# Patient Record
Sex: Female | Born: 2000 | Hispanic: No | Marital: Single | Smoking: Former smoker
Health system: Southern US, Community
[De-identification: ages and names within clinical notes are randomized; demographics above are authoritative.]

## PROBLEM LIST (undated history)

## (undated) ENCOUNTER — Emergency Department (HOSPITAL_COMMUNITY): Admission: EM | Payer: Medicaid Other

## (undated) DIAGNOSIS — R51 Headache: Secondary | ICD-10-CM

## (undated) DIAGNOSIS — B999 Unspecified infectious disease: Secondary | ICD-10-CM

## (undated) DIAGNOSIS — O24419 Gestational diabetes mellitus in pregnancy, unspecified control: Secondary | ICD-10-CM

## (undated) DIAGNOSIS — R519 Headache, unspecified: Secondary | ICD-10-CM

## (undated) HISTORY — PX: NO PAST SURGERIES: SHX2092

---
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
## 2018-06-18 ENCOUNTER — Other Ambulatory Visit: Payer: Self-pay

## 2018-06-18 ENCOUNTER — Inpatient Hospital Stay (HOSPITAL_COMMUNITY)
Admission: AD | Admit: 2018-06-18 | Discharge: 2018-06-18 | Disposition: A | Discharge status: Home / Self Care | Payer: PRIVATE HEALTH INSURANCE | Attending: Obstetrics and Gynecology | Admitting: Obstetrics and Gynecology

## 2018-06-18 ENCOUNTER — Encounter (HOSPITAL_COMMUNITY): Payer: Self-pay | Admitting: *Deleted

## 2018-06-18 DIAGNOSIS — N939 Abnormal uterine and vaginal bleeding, unspecified: Secondary | ICD-10-CM | POA: Insufficient documentation

## 2018-06-18 DIAGNOSIS — N760 Acute vaginitis: Secondary | ICD-10-CM

## 2018-06-18 DIAGNOSIS — B9689 Other specified bacterial agents as the cause of diseases classified elsewhere: Secondary | ICD-10-CM

## 2018-06-18 LAB — CBC
HCT: 37.2 % (ref 36.0–46.0)
Hemoglobin: 11.5 g/dL — ABNORMAL LOW (ref 12.0–15.0)
MCH: 23.5 pg — ABNORMAL LOW (ref 26.0–34.0)
MCHC: 30.9 g/dL (ref 30.0–36.0)
MCV: 75.9 fL — ABNORMAL LOW (ref 80.0–100.0)
NRBC: 0 % (ref 0.0–0.2)
Platelets: 361 10*3/uL (ref 150–400)
RBC: 4.9 MIL/uL (ref 3.87–5.11)
RDW: 17.9 % — AB (ref 11.5–15.5)
WBC: 11.3 10*3/uL — AB (ref 4.0–10.5)

## 2018-06-18 LAB — URINALYSIS, ROUTINE W REFLEX MICROSCOPIC
Bacteria, UA: NONE SEEN
Bilirubin Urine: NEGATIVE
Glucose, UA: NEGATIVE mg/dL
Ketones, ur: NEGATIVE mg/dL
Leukocytes, UA: NEGATIVE
Nitrite: NEGATIVE
Protein, ur: NEGATIVE mg/dL
Specific Gravity, Urine: 1.018 (ref 1.005–1.030)
pH: 7 (ref 5.0–8.0)

## 2018-06-18 LAB — WET PREP, GENITAL
Sperm: NONE SEEN
Trich, Wet Prep: NONE SEEN
Yeast Wet Prep HPF POC: NONE SEEN

## 2018-06-18 LAB — POCT PREGNANCY, URINE: Preg Test, Ur: NEGATIVE

## 2018-06-18 MED ORDER — METRONIDAZOLE 500 MG PO TABS: 500.0000 mg | ORAL_TABLET | Freq: Two times a day (BID) | ORAL | 0 refills | Status: DC

## 2018-06-18 MED ORDER — NORGESTIMATE-ETH ESTRADIOL 0.25-35 MG-MCG PO TABS: 1.0000 | ORAL_TABLET | Freq: Every day | ORAL | 11 refills | Status: DC

## 2018-06-18 NOTE — Discharge Instructions (Signed)
Abnormal Uterine Bleeding  Abnormal uterine bleeding is unusual bleeding from the uterus. It includes:   Bleeding or spotting between periods.   Bleeding after sex.   Bleeding that is heavier than normal.   Periods that last longer than usual.   Bleeding after menopause.  Abnormal uterine bleeding can affect women at various stages in life, including teenagers, women in their reproductive years, pregnant women, and women who have reached menopause. Common causes of abnormal uterine bleeding include:   Pregnancy.   Growths of tissue (polyps).   A noncancerous tumor in the uterus (fibroid).   Infection.   Cancer.   Hormonal imbalances.  Any type of abnormal bleeding should be evaluated by a health care provider. Many cases are minor and simple to treat, while others are more serious. Treatment will depend on the cause of the bleeding.  Follow these instructions at home:   Monitor your condition for any changes.   Do not use tampons, douche, or have sex if told by your health care provider.   Change your pads often.   Get regular exams that include pelvic exams and cervical cancer screening.   Keep all follow-up visits as told by your health care provider. This is important.  Contact a health care provider if:   Your bleeding lasts for more than one week.   You feel dizzy at times.   You feel nauseous or you vomit.  Get help right away if:   You pass out.   Your bleeding soaks through a pad every hour.   You have abdominal pain.   You have a fever.   You become sweaty or weak.   You pass large blood clots from your vagina.  Summary   Abnormal uterine bleeding is unusual bleeding from the uterus.   Any type of abnormal bleeding should be evaluated by a health care provider. Many cases are minor and simple to treat, while others are more serious.   Treatment will depend on the cause of the bleeding.  This information is not intended to replace advice given to you by your health care provider.  Make sure you discuss any questions you have with your health care provider.  Document Released: 06/16/2005 Document Revised: 07/18/2016 Document Reviewed: 07/18/2016  Elsevier Interactive Patient Education  2019 Elsevier Inc.

## 2018-06-18 NOTE — MAU Note (Signed)
Ever since she had her birth control implanted (June), has been bleeding. At times feels weak and there is an odor.

## 2018-06-18 NOTE — Progress Notes (Signed)
Pt obtained vaginal cultures via self swabbing.

## 2018-06-18 NOTE — MAU Provider Note (Signed)
  History     CSN: [id number]  Arrival date and time: 06/18/18 [id number]   First Provider Initiated Contact with Patient 06/18/18 1052      Chief Complaint  Patient presents with  . Vaginal Bleeding  . Abdominal Pain   Katelyn White is a 17 y.o. G1P0010 presenting for continuous vaginal bleeding since August. After her Nexplanon insertion, she had a normal period in July, but her bleeding did not stop after her August cycle. Her bleeding varies from light spotting to heavy bleeding, but she has not had a day without bleeding. On heavy days, she uses a super tampon every 2-3hrs, and had to get up to change overnight pads multiple times per night. Reports new onset of an intermittent fishy odor, constant fatigue, weakness, and always feels cold. Denies SOB or dizziness.    Past Medical History:  Diagnosis Date  . Headache   . Infection    UTI    Past Surgical History:  Procedure Laterality Date  . NO PAST SURGERIES      Family History  Problem Relation Age of Onset  . Diabetes Mother   . Hypertension Father     Social History   Tobacco Use  . Smoking status: Current Some Day Smoker    Years: 2.00    Types: Cigarettes  . Smokeless tobacco: Never Used  Substance Use Topics  . Alcohol use: Yes    Comment: weekends  . Drug use: No    Allergies: No Known Allergies  No medications prior to admission.    Review of Systems  Constitutional: Positive for fatigue. Negative for activity change, appetite change, chills, fever and unexpected weight change.  HENT: Negative.   Eyes: Negative.   Respiratory: Negative.  Negative for shortness of breath.   Cardiovascular: Negative.  Negative for chest pain.  Gastrointestinal: Negative.   Endocrine: Positive for cold intolerance.  Genitourinary: Positive for vaginal bleeding. Negative for pelvic pain and vaginal pain.  Musculoskeletal: Negative.   Skin: Negative.   Allergic/Immunologic: Negative.   Neurological: Positive for  weakness and light-headedness. Negative for dizziness and headaches.  Hematological: Negative.   Psychiatric/Behavioral: Negative.    Physical Exam   Blood pressure 126/73, pulse 83, temperature 98.4 F (36.9 C), resp. rate 16, weight 63.8 kg, SpO2 99 %.  Physical Exam  Constitutional: She is oriented to person, place, and time. She appears well-developed and well-nourished. No distress.  HENT:  Head: Normocephalic.  Eyes:  Sclera pink, without pallor  Neck: Normal range of motion.  Cardiovascular: Normal rate, regular rhythm and normal heart sounds.  Respiratory: Effort normal and breath sounds normal. No respiratory distress.  GI: Soft. Bowel sounds are normal. She exhibits no distension. There is no abdominal tenderness.  Musculoskeletal: Normal range of motion.  Neurological: She is alert and oriented to person, place, and time.  Skin: Skin is warm and dry. She is not diaphoretic.  Psychiatric: She has a normal mood and affect. Her behavior is normal. Judgment and thought content normal.    MAU Course  Procedures  MDM CBC Self swab wet prep, GC/CT  Assessment and Plan  Abnormal uterine bleeding - Norgestimate-Eth Estradiol 0.25-35mg /mcg 1 tablet oral daily Bacterial vaginosis - metronidazole 500mg  oral 2 times daily  Bernerd LimboJamilla R Tomi Paddock, SNM 06/18/2018, 11:19 AM

## 2018-06-21 LAB — GC/CHLAMYDIA PROBE AMP (~~LOC~~) NOT AT ARMC
Chlamydia: NEGATIVE
NEISSERIA GONORRHEA: NEGATIVE

## 2018-06-29 NOTE — MAU Provider Note (Addendum)
History     CSN: [id number]  Arrival date and time: 06/18/18 [id number]   First Provider Initiated Contact with Patient 06/18/18 1052      Chief Complaint  Patient presents with  . Vaginal Bleeding  . Abdominal Pain   HPI  Ms.  Katelyn White is a 17 y.o. year old 451P0010 female who presents to MAU reporting continuous VB since August of this year. She states her period was normal in July; just after insertion of her Nexplanon. In August her period came on schedule, but "has never stopped." She describes the VB as intermittent spotting to heavy bleeding, but she has had no days without VB. She changes a super tampon every 2- 3 hrs and soaks through multiple overnight pads per night. She also now reports anew onset of a fishy odor, fatigue, weakness, and feeling cold all of the time. She denies SOB or dizziness.  Past Medical History:  Diagnosis Date  . Headache   . Infection    UTI    Past Surgical History:  Procedure Laterality Date  . NO PAST SURGERIES      Family History  Problem Relation Age of Onset  . Diabetes Mother   . Hypertension Father     Social History   Tobacco Use  . Smoking status: Current Some Day Smoker    Years: 2.00    Types: Cigarettes  . Smokeless tobacco: Never Used  Substance Use Topics  . Alcohol use: Yes    Comment: weekends  . Drug use: No    Allergies: No Known Allergies  No medications prior to admission.    Review of Systems  Constitutional: Positive for fatigue.  HENT: Negative.   Eyes: Negative.   Respiratory: Negative.   Cardiovascular: Negative.   Gastrointestinal: Negative.   Endocrine: Positive for cold intolerance.  Genitourinary: Positive for vaginal bleeding (spotting to heavy everyday since period started in August).  Musculoskeletal: Negative.   Skin: Negative.   Neurological: Positive for weakness. Negative for dizziness.  Hematological: Negative.   Psychiatric/Behavioral: Negative.    Physical Exam   Blood  pressure 118/63, pulse 77, temperature 98.1 F (36.7 C), temperature source Oral, resp. rate 18, weight 63.8 kg, SpO2 97 %.  Physical Exam  Nursing note and vitals reviewed. Constitutional: She is oriented to person, place, and time. She appears well-developed and well-nourished.  HENT:  Head: Normocephalic and atraumatic.  Eyes: Pupils are equal, round, and reactive to light. Conjunctivae are normal.  Neck: Normal range of motion. Neck supple.  Cardiovascular: Normal rate, regular rhythm, normal heart sounds and intact distal pulses.  Respiratory: Effort normal and breath sounds normal.  GI: Soft. Bowel sounds are normal.  Genitourinary:    Genitourinary Comments: Uterus: non-tender, WP, GC/CT done by self-swab   Musculoskeletal: Normal range of motion.  Neurological: She is alert and oriented to person, place, and time.  Skin: Skin is warm and dry.  Psychiatric: She has a normal mood and affect. Her behavior is normal. Judgment and thought content normal.    MAU Course  Procedures  MDM CCUA UPT CBC Wet Prep GC/CT -- pending   Ref Range & Units 06/18/2018  Color, Urine YELLOW YELLOW   APPearance CLEAR CLEAR   Specific Gravity, Urine 1.005 - 1.030 1.018   pH 5.0 - 8.0 7.0   Glucose, UA NEGATIVE mg/dL NEGATIVE   Hgb urine dipstick NEGATIVE MODERATEAbnormal    Bilirubin Urine NEGATIVE NEGATIVE   Ketones, ur NEGATIVE mg/dL NEGATIVE   Protein,  ur NEGATIVE mg/dL NEGATIVE   Nitrite NEGATIVE NEGATIVE   Leukocytes, UA NEGATIVE NEGATIVE   RBC / HPF 0 - 5 RBC/hpf 0-5   WBC, UA 0 - 5 WBC/hpf 0-5   Bacteria, UA NONE SEEN NONE SEEN   Squamous Epithelial / LPF 0 - 5 0-5   Mucus  PRESENT     Ref Range & Units 06/18/2018  Preg Test, Ur NEGATIVE NEGATIVE   Comment:     THE SENSITIVITY OF THIS  METHODOLOGY IS >24 mIU/mL      Ref Range & Units 06/18/2018   Yeast Wet Prep HPF POC NONE SEEN NONE SEEN    Trich, Wet Prep NONE SEEN NONE SEEN    Clue Cells Wet Prep HPF POC NONE  SEEN PRESENT (Abnormal)   WBC, Wet Prep HPF POC NONE SEEN FEW (Abnormal)       Ref Range & Units 05/2011/2019  WBC 4.0 - 10.5 K/uL 11.3  High    RBC 3.87 - 5.11 MIL/uL 4.90   Hemoglobin 12.0 - 15.0 g/dL 11.911.5  Low    HCT 14.736.0 - 46.0 % 37.2   MCV 80.0 - 100.0 fL 75.9  Low    MCH 26.0 - 34.0 pg 23.5  Low    MCHC 30.0 - 36.0 g/dL 82.930.9   RDW 56.211.5 - 13.015.5 % 17.9  High    Platelets 150 - 400 K/uL 361   nRBC 0.0 - 0.2 % 0.0      Assessment and Plan  Abnormal uterine bleeding (AUB) - Plan: Discharge patient, Discharge patient  Bacterial vaginosis  Allergies as of 06/18/2018   No Known Allergies     Medication List    TAKE these medications   metroNIDAZOLE 500 MG tablet Commonly known as:  FLAGYL Take 1 tablet (500 mg total) by mouth 2 (two) times daily.   norgestimate-ethinyl estradiol 0.25-35 MG-MCG tablet Commonly known as:  SPRINTEC 28 Take 1 tablet by mouth daily.      F/U with [facility] in 1 month Information provided on AUB   Raelyn Moraolitta Jaryn Hocutt, MSN, CNM 06/18/2018, 1:15 PM

## 2020-03-02 ENCOUNTER — Ambulatory Visit: Payer: PRIVATE HEALTH INSURANCE | Admitting: Certified Nurse Midwife

## 2020-03-29 ENCOUNTER — Encounter (HOSPITAL_COMMUNITY): Payer: Self-pay | Admitting: Emergency Medicine

## 2020-03-29 ENCOUNTER — Emergency Department (HOSPITAL_COMMUNITY)
Admission: EM | Admit: 2020-03-29 | Discharge: 2020-03-30 | Disposition: A | Discharge status: Home / Self Care | Payer: Self-pay | Attending: Emergency Medicine | Admitting: Emergency Medicine

## 2020-03-29 ENCOUNTER — Other Ambulatory Visit: Payer: Self-pay

## 2020-03-29 ENCOUNTER — Emergency Department (HOSPITAL_COMMUNITY): Payer: Self-pay

## 2020-03-29 DIAGNOSIS — R0602 Shortness of breath: Secondary | ICD-10-CM | POA: Insufficient documentation

## 2020-03-29 DIAGNOSIS — R079 Chest pain, unspecified: Secondary | ICD-10-CM | POA: Insufficient documentation

## 2020-03-29 DIAGNOSIS — Z5321 Procedure and treatment not carried out due to patient leaving prior to being seen by health care provider: Secondary | ICD-10-CM | POA: Insufficient documentation

## 2020-03-29 LAB — BASIC METABOLIC PANEL
Anion gap: 13 (ref 5–15)
BUN: 6 mg/dL (ref 6–20)
CO2: 22 mmol/L (ref 22–32)
Calcium: 9.1 mg/dL (ref 8.9–10.3)
Chloride: 108 mmol/L (ref 98–111)
Creatinine, Ser: 0.49 mg/dL (ref 0.44–1.00)
GFR calc Af Amer: >60 mL/min (ref >60)
GFR calc non Af Amer: >60 mL/min (ref >60)
Glucose, Bld: 102 mg/dL — ABNORMAL HIGH (ref 70–99)
Potassium: 3.6 mmol/L (ref 3.5–5.1)
Sodium: 143 mmol/L (ref 135–145)

## 2020-03-29 LAB — CBC
HCT: 40.2 % (ref 36.0–46.0)
Hemoglobin: 12.3 g/dL (ref 12.0–15.0)
MCH: 25.1 pg — ABNORMAL LOW (ref 26.0–34.0)
MCHC: 30.6 g/dL (ref 30.0–36.0)
MCV: 81.9 fL (ref 80.0–100.0)
Platelets: 308 10*3/uL (ref 150–400)
RBC: 4.91 MIL/uL (ref 3.87–5.11)
RDW: 14.8 % (ref 11.5–15.5)
WBC: 6.9 10*3/uL (ref 4.0–10.5)
nRBC: 0 % (ref 0.0–0.2)

## 2020-03-29 LAB — TROPONIN I (HIGH SENSITIVITY): Troponin I (High Sensitivity): <2 ng/L (ref <18)

## 2020-03-29 LAB — I-STAT BETA HCG BLOOD, ED (MC, WL, AP ONLY): I-stat hCG, quantitative: <5 m[IU]/mL (ref <5)

## 2020-03-29 NOTE — ED Triage Notes (Signed)
Pt c/o bilateral cp for a while with SOB, wanted to be check today.

## 2020-03-30 LAB — TROPONIN I (HIGH SENSITIVITY): Troponin I (High Sensitivity): 3 ng/L (ref <18)

## 2020-03-30 NOTE — ED Notes (Signed)
Pt left de to not being seen quick enough and saying she felt worse and didn't want to sit here any more

## 2020-06-30 NOTE — L&D Delivery Note (Signed)
OB/[facility]N [facility] Delivery Note  Katelyn White is a 20 y.o. G1P1001 s/p SVD at [redacted]w[redacted]d. She was admitted for SOL.   ROM: 8h 51m with clear fluid GBS Status: Negative   Delivery Date/Time: 04/09/2021 at 2108  Delivery: Called to room and patient was complete and pushing. Head delivered LOA.  Nuchal cord present and delivered through with summersault in order to reduce. Shoulder and body delivered in usual fashion. Infant with spontaneous cry, placed on mother's abdomen, dried and stimulated. Cord clamped x 2 after 1-minute delay, and cut by family member under my direct supervision. Cord blood drawn. Placenta delivered spontaneously with gentle cord traction. Fundus firm with massage and Pitocin. Labia, perineum, vagina, and cervix were inspected, and patient was found to have a 1st degree perineal laceration and bilateral periurethral lacerations. Her 1st degree perineal laceration was repaired with 3-0 Vicryl. The right periurethral laceration was repaired with 4-0 Monocryl. Excellent hemostasis achieved.   Placenta: Intact; 3VC - sent to L&D Complications: None Lacerations: 1st degree perineal, bilateral periurethral  EBL: 250 cc Analgesia: Epidural   Infant: Viable female  APGARs 8 and 24   Evalina Field, MD OB/GYN Fellow, Faculty Practice

## 2020-08-20 LAB — OB RESULTS CONSOLE RUBELLA ANTIBODY, IGM: Rubella: IMMUNE

## 2020-08-20 LAB — OB RESULTS CONSOLE HIV ANTIBODY (ROUTINE TESTING): HIV: NONREACTIVE

## 2020-08-20 LAB — OB RESULTS CONSOLE HEPATITIS B SURFACE ANTIGEN: Hepatitis B Surface Ag: NEGATIVE

## 2020-12-17 ENCOUNTER — Inpatient Hospital Stay (HOSPITAL_COMMUNITY)
Admission: AD | Admit: 2020-12-17 | Discharge: 2020-12-17 | Disposition: A | Discharge status: Home / Self Care | Payer: Medicaid - Out of State | Attending: Obstetrics & Gynecology | Admitting: Obstetrics & Gynecology

## 2020-12-17 ENCOUNTER — Other Ambulatory Visit: Payer: Self-pay

## 2020-12-17 ENCOUNTER — Encounter (HOSPITAL_COMMUNITY): Payer: Self-pay

## 2020-12-17 DIAGNOSIS — M549 Dorsalgia, unspecified: Secondary | ICD-10-CM

## 2020-12-17 DIAGNOSIS — O2312 Infections of bladder in pregnancy, second trimester: Secondary | ICD-10-CM | POA: Diagnosis not present

## 2020-12-17 DIAGNOSIS — M545 Low back pain, unspecified: Secondary | ICD-10-CM | POA: Insufficient documentation

## 2020-12-17 DIAGNOSIS — O99891 Other specified diseases and conditions complicating pregnancy: Secondary | ICD-10-CM

## 2020-12-17 DIAGNOSIS — O26892 Other specified pregnancy related conditions, second trimester: Secondary | ICD-10-CM | POA: Diagnosis not present

## 2020-12-17 DIAGNOSIS — Z87891 Personal history of nicotine dependence: Secondary | ICD-10-CM | POA: Insufficient documentation

## 2020-12-17 DIAGNOSIS — Z3A23 23 weeks gestation of pregnancy: Secondary | ICD-10-CM | POA: Insufficient documentation

## 2020-12-17 DIAGNOSIS — N3000 Acute cystitis without hematuria: Secondary | ICD-10-CM

## 2020-12-17 DIAGNOSIS — N3091 Cystitis, unspecified with hematuria: Secondary | ICD-10-CM | POA: Diagnosis not present

## 2020-12-17 DIAGNOSIS — R0602 Shortness of breath: Secondary | ICD-10-CM | POA: Insufficient documentation

## 2020-12-17 LAB — URINALYSIS, ROUTINE W REFLEX MICROSCOPIC
Bilirubin Urine: NEGATIVE
Glucose, UA: 100 mg/dL — AB
Hgb urine dipstick: NEGATIVE
Ketones, ur: NEGATIVE mg/dL
Nitrite: POSITIVE — AB
Protein, ur: NEGATIVE mg/dL
Specific Gravity, Urine: 1.02 (ref 1.005–1.030)
pH: 6.5 (ref 5.0–8.0)

## 2020-12-17 LAB — URINALYSIS, MICROSCOPIC (REFLEX)

## 2020-12-17 MED ORDER — CYCLOBENZAPRINE HCL 5 MG PO TABS
10.0000 mg | ORAL_TABLET | Freq: Once | ORAL | Status: AC
  Administered 2020-12-17: 10 mg via ORAL
  Filled 2020-12-17: qty 2

## 2020-12-17 MED ORDER — CEFADROXIL 500 MG PO CAPS: 500.0000 mg | ORAL_CAPSULE | Freq: Two times a day (BID) | ORAL | 0 refills | Status: DC

## 2020-12-17 MED ORDER — ACETAMINOPHEN 325 MG PO TABS
650.0000 mg | ORAL_TABLET | Freq: Once | ORAL | Status: AC
  Administered 2020-12-17: 650 mg via ORAL
  Filled 2020-12-17: qty 2

## 2020-12-17 NOTE — ED Triage Notes (Signed)
Patient reports back pain, chills, shortness of breath x a few days but worse today, [redacted] weeks pregnant

## 2020-12-17 NOTE — MAU Note (Signed)
Pt reports mid/lower back pain off/on x one week, much worse today. Hurts to take a deep breath.

## 2020-12-17 NOTE — MAU Provider Note (Addendum)
Patient Katelyn White is a 20 y.o. G1P0000 At [redacted]w[redacted]d here with complaints of back pain that got worse this evening. The pain has been going on for a week but it got worse this evening. She had a single episode of chills at 9 pm. She denies any other complications in this pregnancy. She has been told she is anemic, but she is not taking iron and vit D because she "doesn't have time".    She denies VB, LOF, dysuria, vaginal discharge. She denies all over body aches, fever.   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    History     CSN: [id number]  Arrival date and time: 12/17/20 0017   None     Chief Complaint  Patient presents with   Back Pain   Shortness of Breath   Chills   Back Pain This is a chronic problem. The problem occurs constantly. The problem has been gradually worsening since onset. The pain is present in the lumbar spine. The quality of the pain is described as aching. The pain does not radiate. Pertinent negatives include no dysuria, fever or pelvic pain.  Shortness of Breath Pertinent negatives include no fever.  She feels like it is hard to breathe when she has the back pain, but denies any difficulty breathing.  OB History     Gravida  1   Para  0   Term  0   Preterm  0   AB  0   Living  0      SAB  0   IAB  0   Ectopic  0   Multiple  0   Live Births  0           Past Medical History:  Diagnosis Date   Headache    Infection    UTI    Past Surgical History:  Procedure Laterality Date   NO PAST SURGERIES      Family History  Problem Relation Age of Onset   Diabetes Mother    Hypertension Father     Social History   Tobacco Use   Smoking status: Former    Years: 2.00    Pack years: 0.00    Types: Cigarettes    Quit date: 06/30/2020    Years since quitting: 0.4   Smokeless tobacco: Never  Vaping Use   Vaping Use: Some days  Substance Use Topics   Alcohol  use: Not Currently    Comment: weekends   Drug use: No    Allergies: No Known Allergies  Medications Prior to Admission  Medication Sig Dispense Refill Last Dose   Prenatal Vit-Fe Fumarate-FA (PRENATAL MULTIVITAMIN) TABS tablet Take 1 tablet by mouth daily at 12 noon.      metroNIDAZOLE (FLAGYL) 500 MG tablet Take 1 tablet (500 mg total) by mouth 2 (two) times daily. 14 tablet 0    norgestimate-ethinyl estradiol (SPRINTEC 28) 0.25-35 MG-MCG tablet Take 1 tablet by mouth daily. 1 Package 11     Review of Systems  Constitutional:  Negative for fever.  Respiratory:  Positive for shortness of breath.   Genitourinary:  Negative for dysuria and pelvic pain.  Musculoskeletal:  Positive for back pain.  Neurological:  Positive for dizziness.  Psychiatric/Behavioral: Negative.    Physical Exam   Blood pressure 133/85, pulse (!) 125, temperature 98.9 F (37.2 C), temperature source Oral, resp. rate 18, height  5\' 4"  (1.626 m), weight 83.9 kg, SpO2 99 %.  Physical Exam Constitutional:      Appearance: She is well-developed.  Pulmonary:     Effort: Pulmonary effort is normal.     Breath sounds: No decreased breath sounds.  Abdominal:     Palpations: Abdomen is soft.  Musculoskeletal:        General: Normal range of motion.  Skin:    General: Skin is warm and dry.  Neurological:     Mental Status: She is alert.   Patient is well-appearing, she is moving around in bed and changing positions with exertion, talking and answering questions. She has no labored breathing and is lying flat in bed. She has no CVA tenderness.  MAU Course  Procedures  MDM -vaginal cultures deferred -NST: 150 bpm, mod var, present acel, no decels, no contractions -will try heating pad, flexeril and tylenol -UA show nitrites and leuks  Patient is ambulating, talking, laughing and using the phone while in MAU. Other than her low back pain, she reports that she feels well. Her temp on discharge was 98.6. She  was surprised to learn that she had a UTI as she had no symptoms.  Assessment and Plan   1. Acute cystitis without hematuria   -RX given for cefadroxil; she plans to take this morning when she leaves here. Expressed strong recommendation that she start as soon as possible. If she has fever, all over body aches, flu like symptoms she should go to nearest hospital. She is planning to return to today.  -explained normal changes in pregnancy and back pain, importance of hydration and body mechanics in pregnancy. 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  -recommended that patient get a lumbar support for her chair -recommended that patient start taking her iron pills and VIT D as prescribed; the iron pills will help with her anemia and dizziness.  -follow up in [place] next week at her provider ; informed her that we would call her if her urine culture results shows that her antibiotic needs to be changed. She will be watching for phone call in 2-3 days.  [place] Katelyn White 12/17/2020, 3:26 AM

## 2020-12-17 NOTE — ED Provider Notes (Signed)
Emergency Medicine Provider OB Triage Evaluation Note  Katelyn White is a 20 y.o. female at [redacted]w[redacted]d gestation who presents to the emergency department with complaints of low back pain.  States this is unlike any back pain she has felt before.  States that the pain takes her breath away and times.  States she feels dizzy when she stands up.  Review of  Systems  Positive: flank pain, back pain, dizziness Negative: fever, vaginal discharge/bleeding  Physical Exam  BP 117/78 (BP Location: Right Arm)   Pulse (!) 118   Temp 98.5 F (36.9 C) (Oral)   Resp 17   Ht 5\' 4"  (1.626 m)   Wt 83.9 kg   SpO2 98%   BMI 31.76 kg/m  General: Awake, no distress  HEENT: Atraumatic  Resp: Normal effort  Cardiac: Normal rate Abd: Nondistended, nontender  MSK: Moves all extremities without difficulty Neuro: Speech clear  Medical Decision Making  Pt evaluated for pregnancy concern and is stable for transfer to MAU. Pt is in agreement with plan for transfer.  2:09 AM Discussed with MAU APP, Cat, who accepts patient in transfer.  Clinical Impression   1. Back pain in pregnancy        12/17/20 0211    Palumbo, April, MD 12/17/20 [phone]

## 2020-12-19 LAB — CULTURE, OB URINE: Culture: >=100000 — AB

## 2020-12-20 ENCOUNTER — Other Ambulatory Visit: Payer: Self-pay | Admitting: Obstetrics and Gynecology

## 2020-12-22 ENCOUNTER — Encounter: Payer: Self-pay | Admitting: Student

## 2020-12-22 DIAGNOSIS — O2342 Unspecified infection of urinary tract in pregnancy, second trimester: Secondary | ICD-10-CM | POA: Insufficient documentation

## 2021-03-27 ENCOUNTER — Encounter (HOSPITAL_COMMUNITY): Payer: Self-pay | Admitting: Obstetrics & Gynecology

## 2021-03-27 ENCOUNTER — Other Ambulatory Visit: Payer: Self-pay

## 2021-03-27 ENCOUNTER — Inpatient Hospital Stay (HOSPITAL_COMMUNITY): Payer: Medicaid - Out of State

## 2021-03-27 ENCOUNTER — Inpatient Hospital Stay (HOSPITAL_COMMUNITY)
Admission: AD | Admit: 2021-03-27 | Discharge: 2021-03-28 | Disposition: A | Discharge status: Home / Self Care | Payer: Medicaid - Out of State | Attending: Obstetrics & Gynecology | Admitting: Obstetrics & Gynecology

## 2021-03-27 DIAGNOSIS — R1012 Left upper quadrant pain: Secondary | ICD-10-CM | POA: Diagnosis not present

## 2021-03-27 DIAGNOSIS — O26893 Other specified pregnancy related conditions, third trimester: Secondary | ICD-10-CM | POA: Diagnosis not present

## 2021-03-27 DIAGNOSIS — Z87891 Personal history of nicotine dependence: Secondary | ICD-10-CM | POA: Insufficient documentation

## 2021-03-27 DIAGNOSIS — Z3A37 37 weeks gestation of pregnancy: Secondary | ICD-10-CM | POA: Diagnosis not present

## 2021-03-27 DIAGNOSIS — Z79899 Other long term (current) drug therapy: Secondary | ICD-10-CM | POA: Diagnosis not present

## 2021-03-27 DIAGNOSIS — R0602 Shortness of breath: Secondary | ICD-10-CM | POA: Insufficient documentation

## 2021-03-27 LAB — OB RESULTS CONSOLE GBS: GBS: NEGATIVE

## 2021-03-27 LAB — CBC
HCT: 34.6 % — ABNORMAL LOW (ref 36.0–46.0)
Hemoglobin: 11 g/dL — ABNORMAL LOW (ref 12.0–15.0)
MCH: 23.9 pg — ABNORMAL LOW (ref 26.0–34.0)
MCHC: 31.8 g/dL (ref 30.0–36.0)
MCV: 75.2 fL — ABNORMAL LOW (ref 80.0–100.0)
Platelets: 292 10*3/uL (ref 150–400)
RBC: 4.6 MIL/uL (ref 3.87–5.11)
RDW: 17.2 % — ABNORMAL HIGH (ref 11.5–15.5)
WBC: 10.8 10*3/uL — ABNORMAL HIGH (ref 4.0–10.5)
nRBC: 0 % (ref 0.0–0.2)

## 2021-03-27 NOTE — MAU Provider Note (Addendum)
Chief Complaint:  No chief complaint on file.   Event Date/Time   First Provider Initiated Contact with Patient 03/27/21 2203     HPI: Katelyn White is a 20 y.o. G1P0000 at 53w5dwho presents to maternity admissions reporting shortness of breath for 2 weeks and pain in LUQ when she takes a deep breath.  Pregnancy has been followed in [place] until 2 wks ago when she moved to [place].  Has not been seen here yet.  Waiting for Medicaid.  Presented for similar complaint a year ago but left without being seen.   Denies cough or fever.  Requests Cervical exam "just to see if Im dilated" She reports good fetal movement, denies LOF, vaginal bleeding, vaginal itching/burning, urinary symptoms, h/a, n/v, diarrhea, constipation or fever/chills.  .  Shortness of Breath This is a recurrent problem. The current episode started more than 1 year ago. The problem has been unchanged. Associated symptoms include abdominal pain. Pertinent negatives include no chest pain, fever, headaches, hemoptysis, leg pain, leg swelling, rhinorrhea, sore throat, sputum production or wheezing. Nothing aggravates the symptoms. The patient has no known risk factors for DVT/PE. She has tried nothing for the symptoms.  Abdominal Pain This is a new problem. The current episode started in the past 7 days. The onset quality is gradual. The problem occurs intermittently. The problem is unchanged. The pain is located in the LUQ. The quality of the pain is described as cramping. The pain does not radiate. Pertinent negatives include no fever, headaches or sore throat. (When she takes a deep breath) Nothing relieves the symptoms. Past treatments include nothing.   RN Note: Pt reports to MAU c/o pain on upper left side of her abdomen that hurts when she breaths that started around 1600. Pt also states she has shortness of breath. Pt states she has a lot of pressure in her vagina as well. Pt denies any bleeding or LOF. Pt states she has had a lot  of mucous-like discharge that started this past week. +FM.  Upper abdominal pain score: 6/10 Vaginal pain score: 8/10  Past Medical History: Past Medical History:  Diagnosis Date   Headache    Infection    UTI    Past obstetric history: OB History  Gravida Para Term Preterm AB Living  1 0 0 0 0 0  SAB IAB Ectopic Multiple Live Births  0 0 0 0 0    # Outcome Date GA Lbr Len/2nd Weight Sex Delivery Anes PTL Lv  1 Current             Past Surgical History: Past Surgical History:  Procedure Laterality Date   NO PAST SURGERIES      Family History: Family History  Problem Relation Age of Onset   Diabetes Mother    Hypertension Father     Social History: Social History   Tobacco Use   Smoking status: Former    Years: 2.00    Types: Cigarettes    Quit date: 06/30/2020    Years since quitting: 0.7   Smokeless tobacco: Never  Vaping Use   Vaping Use: Some days  Substance Use Topics   Alcohol use: Not Currently    Comment: weekends   Drug use: No    Allergies: No Known Allergies  Meds:  Medications Prior to Admission  Medication Sig Dispense Refill Last Dose   cefadroxil (DURICEF) 500 MG capsule Take 1 capsule (500 mg total) by mouth 2 (two) times daily. 14 capsule 0  Prenatal Vit-Fe Fumarate-FA (PRENATAL MULTIVITAMIN) TABS tablet Take 1 tablet by mouth daily at 12 noon.       I have reviewed patient's Past Medical Hx, Surgical Hx, Family Hx, Social Hx, medications and allergies.   ROS:  Review of Systems  Constitutional:  Negative for fever.  HENT:  Negative for rhinorrhea and sore throat.   Respiratory:  Positive for shortness of breath. Negative for hemoptysis, sputum production and wheezing.   Cardiovascular:  Negative for chest pain and leg swelling.  Gastrointestinal:  Positive for abdominal pain.  Neurological:  Negative for headaches.  Other systems negative  Physical Exam   Vitals:   03/27/21 2207 03/27/21 2219 03/28/21 0038  BP: 97/64  118/80 128/77  Pulse: (!) 119 (!) 120 (!) 109  Resp: 20    Temp: 97.7 F (36.5 C)    TempSrc: Oral    Weight: 97.8 kg      Constitutional: Well-developed, well-nourished female in no acute distress.  Does not appear dyspneic Cardiovascular: normal rate and rhythm Respiratory: normal effort, clear to auscultation bilaterally GI: Abd soft, non-tender, gravid appropriate for gestational age.   No rebound or guarding. MS: Extremities nontender, no edema, normal ROM Neurologic: Alert and oriented x 4.  GU: Neg CVAT.  PELVIC EXAM:  Dilation: Closed Effacement (%): Thick Exam by:: Fabiola Backer, RN  FHT:  Baseline 145 , moderate variability, accelerations present, no decelerations Contractions: occasional   Labs: Results for orders placed or performed during the hospital encounter of 03/27/21 (from the past 24 hour(s))  CBC     Status: Abnormal   Collection Time: 03/27/21 11:01 PM  Result Value Ref Range   WBC 10.8 (H) 4.0 - 10.5 K/uL   RBC 4.60 3.87 - 5.11 MIL/uL   Hemoglobin 11.0 (L) 12.0 - 15.0 g/dL   HCT 95.6 (L) 38.7 - 56.4 %   MCV 75.2 (L) 80.0 - 100.0 fL   MCH 23.9 (L) 26.0 - 34.0 pg   MCHC 31.8 30.0 - 36.0 g/dL   RDW 33.2 (H) 95.1 - 88.4 %   Platelets 292 150 - 400 K/uL   nRBC 0.0 0.0 - 0.2 %  Troponin I (High Sensitivity)     Status: None   Collection Time: 03/27/21 11:01 PM  Result Value Ref Range   Troponin I (High Sensitivity) 6 <18 ng/L  Brain natriuretic peptide     Status: None   Collection Time: 03/27/21 11:01 PM  Result Value Ref Range   B Natriuretic Peptide 31.4 0.0 - 100.0 pg/mL  Comprehensive metabolic panel     Status: Abnormal   Collection Time: 03/27/21 11:01 PM  Result Value Ref Range   Sodium 136 135 - 145 mmol/L   Potassium 3.8 3.5 - 5.1 mmol/L   Chloride 108 98 - 111 mmol/L   CO2 19 (L) 22 - 32 mmol/L   Glucose, Bld 113 (H) 70 - 99 mg/dL   BUN 8 6 - 20 mg/dL   Creatinine, Ser 1.66 0.44 - 1.00 mg/dL   Calcium 8.7 (L) 8.9 - 10.3 mg/dL    Total Protein 6.1 (L) 6.5 - 8.1 g/dL   Albumin 2.6 (L) 3.5 - 5.0 g/dL   AST 19 15 - 41 U/L   ALT 13 0 - 44 U/L   Alkaline Phosphatase 183 (H) 38 - 126 U/L   Total Bilirubin 0.4 0.3 - 1.2 mg/dL   GFR, Estimated >06 >30 mL/min   Anion gap 9 5 - 15   Imaging:  EKG  showed Sinus Tachcardia, no ectopy  DG Chest 2 View  Result Date: 03/27/2021 CLINICAL DATA:  Shortness of breath and abdominal pain EXAM: CHEST - 2 VIEW COMPARISON:  03/29/2020 FINDINGS: The heart size and mediastinal contours are within normal limits. Both lungs are clear. The visualized skeletal structures are unremarkable. IMPRESSION: No active cardiopulmonary disease. Electronically Signed   By: Deatra Robinson M.D.   On: 03/27/2021 22:56     MAU Course/MDM: I have ordered labs and reviewed results. Labs normal   Troponin and BNP normal  Chest xray is clear.  EKG showed sinus tach without ectopy NST reviewed, reactive Consult Dr Charlotta Newton with presentation, exam findings and test results.  Treatments in MAU included EFM.    Assessment: Single IUP at [redacted]w[redacted]d Shortness of breath, likely due to compression of diaphragm from expanding uterus LUQ pain with deep breath Low suspicion for ACS or PE Interrupted prenatal care  Plan: Discharge home GBS done  Discussed SOB and labor signs Labor precautions and fetal kick counts Follow up in Office for prenatal visits at [facility] (went there for nexplanon) Encouraged to return if she develops worsening of symptoms, increase in pain, fever, or other concerning symptoms.  Pt stable at time of discharge.  Wynelle Bourgeois CNM, MSN Certified Nurse-Midwife 03/27/2021 10:03 PM

## 2021-03-27 NOTE — MAU Note (Signed)
Pt reports to MAU c/o pain on upper left side of her abdomen that hurts when she breaths that started around 1600. Pt also states she has shortness of breath. Pt states she has a lot of pressure in her vagina as well. Pt denies any bleeding or LOF. Pt states she has had a lot of mucous-like discharge that started this past week. +FM.  Upper abdominal pain score: 6/10 Vaginal pain score: 8/10

## 2021-03-28 LAB — COMPREHENSIVE METABOLIC PANEL
ALT: 13 U/L (ref 0–44)
AST: 19 U/L (ref 15–41)
Albumin: 2.6 g/dL — ABNORMAL LOW (ref 3.5–5.0)
Alkaline Phosphatase: 183 U/L — ABNORMAL HIGH (ref 38–126)
Anion gap: 9 (ref 5–15)
BUN: 8 mg/dL (ref 6–20)
CO2: 19 mmol/L — ABNORMAL LOW (ref 22–32)
Calcium: 8.7 mg/dL — ABNORMAL LOW (ref 8.9–10.3)
Chloride: 108 mmol/L (ref 98–111)
Creatinine, Ser: 0.55 mg/dL (ref 0.44–1.00)
GFR, Estimated: >60 mL/min (ref >60)
Glucose, Bld: 113 mg/dL — ABNORMAL HIGH (ref 70–99)
Potassium: 3.8 mmol/L (ref 3.5–5.1)
Sodium: 136 mmol/L (ref 135–145)
Total Bilirubin: 0.4 mg/dL (ref 0.3–1.2)
Total Protein: 6.1 g/dL — ABNORMAL LOW (ref 6.5–8.1)

## 2021-03-28 LAB — TROPONIN I (HIGH SENSITIVITY): Troponin I (High Sensitivity): 6 ng/L (ref <18)

## 2021-03-28 LAB — BRAIN NATRIURETIC PEPTIDE: B Natriuretic Peptide: 31.4 pg/mL (ref 0.0–100.0)

## 2021-03-30 LAB — CULTURE, BETA STREP (GROUP B ONLY)

## 2021-04-08 ENCOUNTER — Encounter (HOSPITAL_COMMUNITY): Payer: Self-pay | Admitting: Family Medicine

## 2021-04-08 ENCOUNTER — Inpatient Hospital Stay (HOSPITAL_COMMUNITY)
Admission: AD | Admit: 2021-04-08 | Discharge: 2021-04-11 | DRG: 807 | Disposition: A | Discharge status: Home / Self Care | Payer: Medicaid Other | Attending: Obstetrics and Gynecology | Admitting: Obstetrics and Gynecology

## 2021-04-08 ENCOUNTER — Other Ambulatory Visit: Payer: Self-pay

## 2021-04-08 ENCOUNTER — Inpatient Hospital Stay (HOSPITAL_COMMUNITY)
Admission: AD | Admit: 2021-04-08 | Discharge: 2021-04-08 | Disposition: A | Discharge status: Home / Self Care | Payer: Medicaid Other | Attending: Family Medicine | Admitting: Family Medicine

## 2021-04-08 DIAGNOSIS — Z20822 Contact with and (suspected) exposure to covid-19: Secondary | ICD-10-CM | POA: Diagnosis present

## 2021-04-08 DIAGNOSIS — Z3A39 39 weeks gestation of pregnancy: Secondary | ICD-10-CM

## 2021-04-08 DIAGNOSIS — O134 Gestational [pregnancy-induced] hypertension without significant proteinuria, complicating childbirth: Principal | ICD-10-CM | POA: Diagnosis present

## 2021-04-08 DIAGNOSIS — Z23 Encounter for immunization: Secondary | ICD-10-CM

## 2021-04-08 DIAGNOSIS — O471 False labor at or after 37 completed weeks of gestation: Secondary | ICD-10-CM | POA: Insufficient documentation

## 2021-04-08 DIAGNOSIS — D509 Iron deficiency anemia, unspecified: Secondary | ICD-10-CM | POA: Diagnosis present

## 2021-04-08 DIAGNOSIS — O9902 Anemia complicating childbirth: Secondary | ICD-10-CM | POA: Diagnosis present

## 2021-04-08 DIAGNOSIS — Z87891 Personal history of nicotine dependence: Secondary | ICD-10-CM

## 2021-04-08 LAB — URINALYSIS, ROUTINE W REFLEX MICROSCOPIC
Bilirubin Urine: NEGATIVE
Glucose, UA: NEGATIVE mg/dL
Hgb urine dipstick: NEGATIVE
Ketones, ur: NEGATIVE mg/dL
Leukocytes,Ua: NEGATIVE
Nitrite: NEGATIVE
Protein, ur: NEGATIVE mg/dL
Specific Gravity, Urine: 1.014 (ref 1.005–1.030)
pH: 6 (ref 5.0–8.0)

## 2021-04-08 NOTE — MAU Note (Addendum)
Presents with c/o ctxs that began this morning @ 0700, reports ctxs are 5 minutes apart.  Denies VB or LOF.  Endorses +FM.  No PNC since August 2022, moved from [place], [place].

## 2021-04-08 NOTE — MAU Provider Note (Addendum)
S: Ms. ECHO PROPP is a 20 y.o. G1P0000 at [redacted]w[redacted]d  who presents to MAU today for labor evaluation.     Cervical exam by RN:  Dilation: 2 Effacement (%): 80 Station: -3 Presentation: Vertex Exam by:: J.Bellamy, RN  Fetal Monitoring: Baseline: 130 Variability: moderate Accelerations: ++ Decelerations: none Contractions: q4-5  MDM Discussed patient with RN. NST reviewed.   A: SIUP at [redacted]w[redacted]d  False labor  P: Discharge home Patient may return to MAU as needed or when in labor   Levie Heritage, DO 04/08/2021 3:19 PM

## 2021-04-09 ENCOUNTER — Encounter (HOSPITAL_COMMUNITY): Payer: Self-pay | Admitting: Obstetrics and Gynecology

## 2021-04-09 ENCOUNTER — Inpatient Hospital Stay (HOSPITAL_COMMUNITY): Payer: Medicaid Other | Admitting: Anesthesiology

## 2021-04-09 DIAGNOSIS — O9902 Anemia complicating childbirth: Secondary | ICD-10-CM | POA: Diagnosis present

## 2021-04-09 DIAGNOSIS — Z87891 Personal history of nicotine dependence: Secondary | ICD-10-CM | POA: Diagnosis not present

## 2021-04-09 DIAGNOSIS — Z20822 Contact with and (suspected) exposure to covid-19: Secondary | ICD-10-CM | POA: Diagnosis present

## 2021-04-09 DIAGNOSIS — O134 Gestational [pregnancy-induced] hypertension without significant proteinuria, complicating childbirth: Secondary | ICD-10-CM | POA: Diagnosis present

## 2021-04-09 DIAGNOSIS — Z3A39 39 weeks gestation of pregnancy: Secondary | ICD-10-CM

## 2021-04-09 DIAGNOSIS — Z23 Encounter for immunization: Secondary | ICD-10-CM | POA: Diagnosis not present

## 2021-04-09 DIAGNOSIS — O26893 Other specified pregnancy related conditions, third trimester: Secondary | ICD-10-CM | POA: Diagnosis present

## 2021-04-09 DIAGNOSIS — D509 Iron deficiency anemia, unspecified: Secondary | ICD-10-CM | POA: Diagnosis present

## 2021-04-09 LAB — COMPREHENSIVE METABOLIC PANEL
ALT: 11 U/L (ref 0–44)
AST: 20 U/L (ref 15–41)
Albumin: 2.7 g/dL — ABNORMAL LOW (ref 3.5–5.0)
Alkaline Phosphatase: 203 U/L — ABNORMAL HIGH (ref 38–126)
Anion gap: 13 (ref 5–15)
BUN: 8 mg/dL (ref 6–20)
CO2: 17 mmol/L — ABNORMAL LOW (ref 22–32)
Calcium: 9.1 mg/dL (ref 8.9–10.3)
Chloride: 104 mmol/L (ref 98–111)
Creatinine, Ser: 0.53 mg/dL (ref 0.44–1.00)
GFR, Estimated: >60 mL/min (ref >60)
Glucose, Bld: 89 mg/dL (ref 70–99)
Potassium: 4 mmol/L (ref 3.5–5.1)
Sodium: 134 mmol/L — ABNORMAL LOW (ref 135–145)
Total Bilirubin: 0.5 mg/dL (ref 0.3–1.2)
Total Protein: 6.3 g/dL — ABNORMAL LOW (ref 6.5–8.1)

## 2021-04-09 LAB — CBC
HCT: 37.6 % (ref 36.0–46.0)
Hemoglobin: 11.8 g/dL — ABNORMAL LOW (ref 12.0–15.0)
MCH: 23.6 pg — ABNORMAL LOW (ref 26.0–34.0)
MCHC: 31.4 g/dL (ref 30.0–36.0)
MCV: 75 fL — ABNORMAL LOW (ref 80.0–100.0)
Platelets: 301 10*3/uL (ref 150–400)
RBC: 5.01 MIL/uL (ref 3.87–5.11)
RDW: 19.3 % — ABNORMAL HIGH (ref 11.5–15.5)
WBC: 15.5 10*3/uL — ABNORMAL HIGH (ref 4.0–10.5)
nRBC: 0 % (ref 0.0–0.2)

## 2021-04-09 LAB — RESP PANEL BY RT-PCR (FLU A&B, COVID) ARPGX2
Influenza A by PCR: NEGATIVE
Influenza B by PCR: NEGATIVE
SARS Coronavirus 2 by RT PCR: NEGATIVE

## 2021-04-09 LAB — RPR: RPR Ser Ql: NONREACTIVE

## 2021-04-09 LAB — TYPE AND SCREEN
ABO/RH(D): O POS
Antibody Screen: NEGATIVE

## 2021-04-09 LAB — PROTEIN / CREATININE RATIO, URINE
Creatinine, Urine: 139.29 mg/dL
Protein Creatinine Ratio: 0.06 mg/mg{Cre} (ref 0.00–0.15)
Total Protein, Urine: 9 mg/dL

## 2021-04-09 MED ORDER — ZOLPIDEM TARTRATE 5 MG PO TABS: 5.0000 mg | ORAL_TABLET | Freq: Every evening | ORAL | Status: DC | PRN

## 2021-04-09 MED ORDER — OXYTOCIN-SODIUM CHLORIDE 30-0.9 UT/500ML-% IV SOLN: 1.0000 m[IU]/min | INTRAVENOUS | Status: DC

## 2021-04-09 MED ORDER — ACETAMINOPHEN 325 MG PO TABS: 650.0000 mg | ORAL_TABLET | ORAL | Status: DC | PRN

## 2021-04-09 MED ORDER — OXYTOCIN-SODIUM CHLORIDE 30-0.9 UT/500ML-% IV SOLN
1.0000 m[IU]/min | INTRAVENOUS | Status: DC
Start: 2021-04-09 — End: 2021-04-09
  Administered 2021-04-09: 2 m[IU]/min via INTRAVENOUS

## 2021-04-09 MED ORDER — FENTANYL CITRATE (PF) 100 MCG/2ML IJ SOLN: 50.0000 ug | INTRAMUSCULAR | Status: DC | PRN

## 2021-04-09 MED ORDER — PHENYLEPHRINE 40 MCG/ML (10ML) SYRINGE FOR IV PUSH (FOR BLOOD PRESSURE SUPPORT): 80.0000 ug | PREFILLED_SYRINGE | INTRAVENOUS | Status: DC | PRN

## 2021-04-09 MED ORDER — LIDOCAINE HCL (PF) 1 % IJ SOLN
INTRAMUSCULAR | Status: DC | PRN
  Administered 2021-04-09: 8 mL via EPIDURAL

## 2021-04-09 MED ORDER — FENTANYL-BUPIVACAINE-NACL 0.5-0.125-0.9 MG/250ML-% EP SOLN
12.0000 mL/h | EPIDURAL | Status: DC | PRN
  Administered 2021-04-09 (×2): 12 mL/h via EPIDURAL
  Filled 2021-04-09: qty 250

## 2021-04-09 MED ORDER — OXYCODONE-ACETAMINOPHEN 5-325 MG PO TABS: 1.0000 | ORAL_TABLET | ORAL | Status: DC | PRN

## 2021-04-09 MED ORDER — LIDOCAINE HCL (PF) 1 % IJ SOLN
30.0000 mL | INTRAMUSCULAR | Status: DC | PRN
  Filled 2021-04-09: qty 30

## 2021-04-09 MED ORDER — TERBUTALINE SULFATE 1 MG/ML IJ SOLN: 0.2500 mg | Freq: Once | INTRAMUSCULAR | Status: DC | PRN

## 2021-04-09 MED ORDER — LACTATED RINGERS IV SOLN: INTRAVENOUS | Status: DC

## 2021-04-09 MED ORDER — LACTATED RINGERS IV SOLN: 500.0000 mL | INTRAVENOUS | Status: DC | PRN

## 2021-04-09 MED ORDER — LACTATED RINGERS IV SOLN: 500.0000 mL | Freq: Once | INTRAVENOUS | Status: DC

## 2021-04-09 MED ORDER — OXYTOCIN-SODIUM CHLORIDE 30-0.9 UT/500ML-% IV SOLN
2.5000 [IU]/h | INTRAVENOUS | Status: DC
  Filled 2021-04-09: qty 500

## 2021-04-09 MED ORDER — SOD CITRATE-CITRIC ACID 500-334 MG/5ML PO SOLN: 30.0000 mL | ORAL | Status: DC | PRN

## 2021-04-09 MED ORDER — EPHEDRINE 5 MG/ML INJ
10.0000 mg | INTRAVENOUS | Status: DC | PRN
Start: 2021-04-09 — End: 2021-04-09

## 2021-04-09 MED ORDER — DIPHENHYDRAMINE HCL 50 MG/ML IJ SOLN: 12.5000 mg | INTRAMUSCULAR | Status: DC | PRN

## 2021-04-09 MED ORDER — OXYCODONE-ACETAMINOPHEN 5-325 MG PO TABS: 2.0000 | ORAL_TABLET | ORAL | Status: DC | PRN

## 2021-04-09 MED ORDER — ONDANSETRON HCL 4 MG/2ML IJ SOLN
4.0000 mg | Freq: Four times a day (QID) | INTRAMUSCULAR | Status: DC | PRN
Start: 2021-04-09 — End: 2021-04-09

## 2021-04-09 MED ORDER — EPHEDRINE 5 MG/ML INJ: 10.0000 mg | INTRAVENOUS | Status: DC | PRN

## 2021-04-09 MED ORDER — MISOPROSTOL 25 MCG QUARTER TABLET: 25.0000 ug | ORAL_TABLET | ORAL | Status: DC | PRN

## 2021-04-09 MED ORDER — OXYTOCIN BOLUS FROM INFUSION: 333.0000 mL | Freq: Once | INTRAVENOUS | Status: DC

## 2021-04-09 NOTE — Progress Notes (Signed)
Labor Progress Note Katelyn White is a 20 y.o. G1P0000 at [redacted]w[redacted]d presented for SOL S: No complaints. Comfortable with epidural  O:  BP 124/73   Pulse (!) 105   Temp 98.7 F (37.1 C) (Axillary)   Resp 16  EFM: 145/good variability/+accels/ occasional early decels  CVE: Dilation: 8.5 Effacement (%): 80 Station: -1, 0 Presentation: Vertex Exam by:: Martyn Malay, RN   A&P: 20 y.o. G1P0000 [redacted]w[redacted]d presented for SOL #Labor: No progress since last check. Contractions spacing out. Performed AROM with clear fluid. Will recheck in 2 hours, consider pitocin if no progress #Pain: none currently, has epidural #FWB: cat 1 #GBS negative  [name], PGY-1, Faculty Service 12:52 PM

## 2021-04-09 NOTE — Discharge Summary (Addendum)
Postpartum Discharge Summary     Patient Name: Katelyn White DOB: 09-May-2001 MRN: [id number]  Date of admission: 04/08/2021 Delivery date:04/09/2021  Delivering provider: Genia Del  Date of discharge: 04/11/2021  Admitting diagnosis: Normal labor [O80, Z37.9] Intrauterine pregnancy: [redacted]w[redacted]d    Secondary diagnosis:  Principal Problem:   Vaginal delivery  Additional problems: Gestational HTN intrapartum    Discharge diagnosis: Term Pregnancy Delivered                                              Post partum procedures: None Augmentation: AROM and Pitocin Complications: None  Hospital course: Onset of Labor With Vaginal Delivery      20y.o. yo G1P0000 at 338w4das admitted in Active Labor on 04/08/2021. Patient had an uncomplicated labor course as follows:  Membrane Rupture Time/Date: 12:49 PM ,04/09/2021   Delivery Method:Vaginal, Spontaneous  Episiotomy: None  Lacerations:  1st degree;Periurethral  Postpartum she was started on Procardia 30XL with BP well controlled. She is ambulating, tolerating a regular diet, passing flatus, and urinating well. No headache, vision changes, RUQ pain, chest pain, shortness of breath, or pedal edema. She will have a blood pressure check in one week. Patient is discharged home in stable condition on 04/11/21.  Newborn Data: Birth date:04/09/2021  Birth time:9:08 PM  Gender:Female  Living status:Living  Apgars:8 ,9  Weight:3980 g   Magnesium Sulfate received: No BMZ received: No Rhophylac: N/A MMR: N/A - Immune  T-DaP: Given prenatally Flu: No Transfusion: No  Physical exam  Vitals:   04/10/21 0810 04/10/21 1154 04/10/21 2042 04/11/21 0532  BP: (!) 132/58 123/66 130/87 128/88  Pulse: 69 89 80 78  Resp: 17  19   Temp: 98 F (36.7 C)  98.7 F (37.1 C)   TempSrc: Oral  Oral   SpO2:   100%    General: alert, cooperative, and no distress Lochia: appropriate Uterine Fundus: firm and below umbilicus  Incision:  N/A DVT Evaluation: No evidence of DVT seen on physical exam. No significant calf/ankle edema.  Labs: Lab Results  Component Value Date   WBC 15.5 (H) 04/09/2021   HGB 11.8 (L) 04/09/2021   HCT 37.6 04/09/2021   MCV 75.0 (L) 04/09/2021   PLT 301 04/09/2021   CMP Latest Ref Rng & Units 04/09/2021  Glucose 70 - 99 mg/dL 89  BUN 6 - 20 mg/dL 8  Creatinine 0.44 - 1.00 mg/dL 0.53  Sodium 135 - 145 mmol/L 134(L)  Potassium 3.5 - 5.1 mmol/L 4.0  Chloride 98 - 111 mmol/L 104  CO2 22 - 32 mmol/L 17(L)  Calcium 8.9 - 10.3 mg/dL 9.1  Total Protein 6.5 - 8.1 g/dL 6.3(L)  Total Bilirubin 0.3 - 1.2 mg/dL 0.5  Alkaline Phos 38 - 126 U/L 203(H)  AST 15 - 41 U/L 20  ALT 0 - 44 U/L 11   Edinburgh Score: Edinburgh Postnatal Depression Scale Screening Tool 04/10/2021  I have been able to laugh and see the funny side of things. 0  I have looked forward with enjoyment to things. 0  I have blamed myself unnecessarily when things went wrong. 0  I have been anxious or worried for no good reason. 0  I have felt scared or panicky for no good reason. 0  Things have been getting on top of me. 1  I have been  so unhappy that I have had difficulty sleeping. 0  I have felt sad or miserable. 0  I have been so unhappy that I have been crying. 0  The thought of harming myself has occurred to me. 0  Edinburgh Postnatal Depression Scale Total 1     After visit meds:  Allergies as of 04/11/2021   No Known Allergies      Medication List     TAKE these medications    acetaminophen 500 MG tablet Commonly known as: TYLENOL Take 2 tablets (1,000 mg total) by mouth every 8 (eight) hours as needed (pain).   ibuprofen 600 MG tablet Commonly known as: ADVIL Take 1 tablet (600 mg total) by mouth every 6 (six) hours as needed (pain).   NIFEdipine 30 MG 24 hr tablet Commonly known as: ADALAT CC Take 1 tablet (30 mg total) by mouth daily.   prenatal multivitamin Tabs tablet Take 1 tablet by mouth  daily at 12 noon.         Discharge home in stable condition Infant Feeding: Bottle and Breast Infant Disposition: home with mother Discharge instruction: per After Visit Summary and Postpartum booklet. Activity: Advance as tolerated. Pelvic rest for 6 weeks.  Diet: routine diet Future Appointments: Future Appointments  Date Time Provider [place]  04/16/2021 10:30 AM [place] None  05/08/2021  9:55 AM Gabriel Carina, CNM [facility] None   Follow up Visit: Message sent to [facility] by Dr. Gwenlyn Perking on 04/09/21.   Please schedule this patient for a In person postpartum visit in 4 weeks with the following provider: Any provider. Additional Postpartum F/U: BP check 1 week  Low risk pregnancy complicated by:  gHTN intrapartum Delivery mode:  Vaginal, Spontaneous  Anticipated Birth Control:  Unsure, considering IUD outpt   04/11/2021 Mayer Masker, PGY-1, Faculty Service  GME ATTESTATION:  I saw and evaluated the patient. I agree with the findings and the plan of care as documented in the resident's note. I have made changes to documentation as necessary.  Progressing well and meeting all postpartum milestones. BP improved with Procardia 30 mg XL. Will continue upon discharge and recheck BP in one week. Babyscripts and medications to bedside ordered as well. Stable for discharge.   Vilma Meckel, MD OB Fellow, [place] [facility] 04/11/2021 9:40 AM

## 2021-04-09 NOTE — MAU Note (Signed)
Pt reports to MAU stating she was here earlier today and got d/c home but has since gotten more uncomfortable with her ctx and rating them a  10/10 pain score. Denies LOF or bleeding. +FM

## 2021-04-09 NOTE — Anesthesia Procedure Notes (Signed)
Epidural Patient location during procedure: OB Start time: 04/09/2021 4:20 AM End time: 04/09/2021 4:30 AM  Staffing Anesthesiologist: Mellody Dance, MD Performed: anesthesiologist   Preanesthetic Checklist Completed: patient identified, IV checked, site marked, risks and benefits discussed, monitors and equipment checked, pre-op evaluation and timeout performed  Epidural Patient position: sitting Prep: DuraPrep Patient monitoring: heart rate, cardiac monitor, continuous pulse ox and blood pressure Approach: midline Location: L2-L3 Injection technique: LOR saline  Needle:  Needle type: Tuohy  Needle gauge: 17 G Needle length: 9 cm Needle insertion depth: 7 cm Catheter type: closed end flexible Catheter size: 20 Guage Catheter at skin depth: 12 cm Test dose: negative and Other  Assessment Events: blood not aspirated, injection not painful, no injection resistance and negative IV test  Additional Notes Informed consent obtained prior to proceeding including risk of failure, 1% risk of PDPH, risk of minor discomfort and bruising.  Discussed rare but serious complications including epidural abscess, permanent nerve injury, epidural hematoma.  Discussed alternatives to epidural analgesia and patient desires to proceed.  Timeout performed pre-procedure verifying patient name, procedure, and platelet count.  Patient tolerated procedure well.

## 2021-04-09 NOTE — Anesthesia Preprocedure Evaluation (Signed)
Anesthesia Evaluation  Patient identified by MRN, date of birth, ID band Patient awake    Reviewed: Allergy & Precautions, NPO status , Patient's Chart, lab work & pertinent test results  Airway Mallampati: II  TM Distance: >3 FB Neck ROM: Full    Dental no notable dental hx.    Pulmonary neg pulmonary ROS, former smoker,    Pulmonary exam normal breath sounds clear to auscultation       Cardiovascular negative cardio ROS Normal cardiovascular exam Rhythm:Regular Rate:Normal     Neuro/Psych  Headaches, negative psych ROS   GI/Hepatic negative GI ROS, Neg liver ROS,   Endo/Other  negative endocrine ROS  Renal/GU negative Renal ROS  negative genitourinary   Musculoskeletal negative musculoskeletal ROS (+)   Abdominal   Peds negative pediatric ROS (+)  Hematology negative hematology ROS (+)   Anesthesia Other Findings   Reproductive/Obstetrics (+) Pregnancy                             Anesthesia Physical Anesthesia Plan  ASA: 2  Anesthesia Plan: Epidural   Post-op Pain Management:    Induction:   PONV Risk Score and Plan: 2 and Treatment may vary due to age or medical condition  Airway Management Planned: Natural Airway  Additional Equipment: None  Intra-op Plan:   Post-operative Plan:   Informed Consent: I have reviewed the patients History and Physical, chart, labs and discussed the procedure including the risks, benefits and alternatives for the proposed anesthesia with the patient or authorized representative who has indicated his/her understanding and acceptance.       Plan Discussed with: Anesthesiologist  Anesthesia Plan Comments:         Anesthesia Quick Evaluation

## 2021-04-09 NOTE — Progress Notes (Signed)
Dr. Mathis Fare notified of elevated BPS. MD will monitor overnight. Ok to transfer to [employer]

## 2021-04-09 NOTE — H&P (Signed)
OBSTETRIC ADMISSION HISTORY AND PHYSICAL  Katelyn White is a 20 y.o. female G1P0000 with IUP at [redacted]w[redacted]d by early [place] presenting for SOL. She reports +FMs, no LOF, no VB, no blurry vision, headaches, peripheral edema, or RUQ pain.  She plans on breast and formula feeding. She is undecided about birth control postpartum.  She received her prenatal care at  [facility]. She planned to transfer to [facility] but did not make it to appointment.     Dating: By [place] --->  Estimated Date of Delivery: 04/12/21  Sono: (Per CareEverywhere)  @[redacted]w[redacted]d , right choroid plexus cyst, otherwise normal anatomy, cephalic presentation, posterior placental lie, 351 g  Prenatal History/Complications:  Anemia in third trimester of pregnancy Choroid plexus cyst on fetus  Past Medical History: Past Medical History:  Diagnosis Date   Headache    Infection    UTI    Past Surgical History: Past Surgical History:  Procedure Laterality Date   NO PAST SURGERIES      Obstetrical History: OB History     Gravida  1   Para  0   Term  0   Preterm  0   AB  0   Living  0      SAB  0   IAB  0   Ectopic  0   Multiple  0   Live Births  0           Social History Social History   Socioeconomic History   Marital status: Single    Spouse name: Not on file   Number of children: Not on file   Years of education: Not on file   Highest education level: Not on file  Occupational History   Not on file  Tobacco Use   Smoking status: Former    Years: 2.00    Types: Cigarettes    Quit date: 06/30/2020    Years since quitting: 0.7   Smokeless tobacco: Never  Vaping Use   Vaping Use: Former  Substance and Sexual Activity   Alcohol use: Not Currently    Comment: weekends   Drug use: No   Sexual activity: Not Currently    Birth control/protection: None  Other Topics Concern   Not on file  Social History Narrative   Not on file   Social Determinants of Health    Financial Resource Strain: Not on file  Food Insecurity: Not on file  Transportation Needs: Not on file  Physical Activity: Not on file  Stress: Not on file  Social Connections: Not on file    Family History: Family History  Problem Relation Age of Onset   Diabetes Mother    Stroke Father    Hypertension Father     Allergies: No Known Allergies  Medications Prior to Admission  Medication Sig Dispense Refill Last Dose   Prenatal Vit-Fe Fumarate-FA (PRENATAL MULTIVITAMIN) TABS tablet Take 1 tablet by mouth daily at 12 noon.   04/08/2021     Review of Systems  All systems reviewed and negative except as stated in HPI  Blood pressure (!) 148/85, pulse 88, temperature 97.8 F (36.6 C), temperature source Oral, resp. rate 18.  General appearance: alert, cooperative, and moderate distress with contractions Lungs: normal work of breathing on room air  Heart: normal rate, warm and well perfused  Abdomen: soft, non-tender, gravid  Extremities: no LE edema or calf tenderness to palpation   Presentation: cephalic Fetal monitoring: Baseline 150 bpm, moderate variability, + accels, early/variable decels  Uterine activity: Every 2-4 minutes  Dilation: 6 Effacement (%): 80 Station: -2 Exam by:: Janice Norrie RN   Prenatal labs: ABO, Rh: --/--/O POS (10/11 0018) Antibody: NEG (10/11 0018) Rubella:  81.00 -Immune on 08/20/20 RPR:  NR 08/20/20 and 01/23/21 HBsAg:  Neg 08/20/20  HIV:  Neg 08/20/20 GBS: Negative/-- (09/28 0000) 1 hr Glucola elevated at 169; 3 hour Glucola normal Genetic screening - Unable to view per records in [place] - right CP cyst; otherwise normal   Prenatal Transfer Tool  Maternal Diabetes: No Genetic Screening: Not on file - will request records Maternal Ultrasounds/Referrals: Right choroid plexus cyst - otherwise normal Fetal Ultrasounds or other Referrals:  None Maternal Substance Abuse:  No Significant Maternal Medications:   None Significant Maternal Lab Results: Group B Strep negative  Results for orders placed or performed during the hospital encounter of 04/08/21 (from the past 24 hour(s))  CBC   Collection Time: 04/09/21 12:18 AM  Result Value Ref Range   WBC 15.5 (H) 4.0 - 10.5 K/uL   RBC 5.01 3.87 - 5.11 MIL/uL   Hemoglobin 11.8 (L) 12.0 - 15.0 g/dL   HCT 46.6 59.9 - 35.7 %   MCV 75.0 (L) 80.0 - 100.0 fL   MCH 23.6 (L) 26.0 - 34.0 pg   MCHC 31.4 30.0 - 36.0 g/dL   RDW 01.7 (H) 79.3 - 90.3 %   Platelets 301 150 - 400 K/uL   nRBC 0.0 0.0 - 0.2 %  Type and screen [facility]   Collection Time: 04/09/21 12:18 AM  Result Value Ref Range   ABO/RH(D) O POS    Antibody Screen NEG    Sample Expiration      04/12/2021,2359 Performed at [facility], [address] [place], [place] [address]   Resp Panel by RT-PCR (Flu A&B, Covid) Nasopharyngeal Swab   Collection Time: 04/09/21 12:35 AM   Specimen: Nasopharyngeal Swab; Nasopharyngeal(NP) swabs in vial transport medium  Result Value Ref Range   SARS Coronavirus 2 by RT PCR NEGATIVE NEGATIVE   Influenza A by PCR NEGATIVE NEGATIVE   Influenza B by PCR NEGATIVE NEGATIVE  Results for orders placed or performed during the hospital encounter of 04/08/21 (from the past 24 hour(s))  Urinalysis, Routine w reflex microscopic Urine, Clean Catch   Collection Time: 04/08/21 12:39 PM  Result Value Ref Range   Color, Urine YELLOW YELLOW   APPearance HAZY (A) CLEAR   Specific Gravity, Urine 1.014 1.005 - 1.030   pH 6.0 5.0 - 8.0   Glucose, UA NEGATIVE NEGATIVE mg/dL   Hgb urine dipstick NEGATIVE NEGATIVE   Bilirubin Urine NEGATIVE NEGATIVE   Ketones, ur NEGATIVE NEGATIVE mg/dL   Protein, ur NEGATIVE NEGATIVE mg/dL   Nitrite NEGATIVE NEGATIVE   Leukocytes,Ua NEGATIVE NEGATIVE    Patient Active Problem List   Diagnosis Date Noted   Normal labor 04/09/2021   UTI (urinary tract infection) during pregnancy, second trimester 12/22/2020     Assessment/Plan:  Katelyn White is a 20 y.o. G1P0000 at [redacted]w[redacted]d here for SOL.  #Labor: Expectantly managed upon arrival to L&D. Recheck unchanged. Will plan for AROM once comfortable with epidural per patient request.  #Pain: PRN - planning for epidural  #FWB: Cat 2 due to intermittent variables; reassuring variability and accels - will continue to monitor  #ID:  GBS neg #MOF: Breast and formula  #MOC: Undecided   #Elevated BP: Mild range BP since admission. No hx of elevated BP this  pregnancy per patient. CBC with normal platelets. Will add CMP and UPC for further evaluation and continue to monitor. No symptoms at this time.   Worthy Rancher, MD  04/09/2021, 4:45 AM

## 2021-04-09 NOTE — Progress Notes (Signed)
Labor Progress Note Katelyn White is a 20 y.o. G1P0000 at [redacted]w[redacted]d presented for SOL S: Resting comfortably in bed, intermittently sleeping. Does not feel contractions.  O:  BP 138/79   Pulse 92   Temp 98.5 F (36.9 C) (Axillary)   Resp 16  EFM: 145/good variability/+accels/no decels  CVE: Dilation: 10 Dilation Complete Date: 04/09/21 Dilation Complete Time: 1700 Effacement (%): 80 Station: 0 Presentation: Vertex Exam by:: mICHELLE, RN   A&P: 20 y.o. G1P0000 [redacted]w[redacted]d who presented for SOL #Labor: Progressing well. Complete but still at 0 station without urge to push. Will start pitocin #Pain: well controlled. Epidural in place #FWB: cat 1 #GBS negative #Anemia: HGB 11.8 on admission  [name], PGY-1, Faculty Service 5:08 PM

## 2021-04-09 NOTE — Lactation Note (Addendum)
This note was copied from a baby's chart. Lactation Consultation Note  Patient Name: Katelyn White Date: 04/09/2021 Reason for consult: L&D Initial assessment;Mother's request;Primapara;1st time breastfeeding Age:22 hours  RN, Claudina Lick, assisting Mom with latching. Mom preferences stated below. Mom does have WIC.  Mom to receive further LC support on the floor.   Maternal Data    Feeding Mother's Current Feeding Choice: Breast Milk and Formula  LATCH Score Latch: Repeated attempts needed to sustain latch, nipple held in mouth throughout feeding, stimulation needed to elicit sucking reflex.  Audible Swallowing: A few with stimulation  Type of Nipple: Inverted  Comfort (Breast/Nipple): Soft / non-tender  Hold (Positioning): Assistance needed to correctly position infant at breast and maintain latch.  LATCH Score: 5   Lactation Tools Discussed/Used    Interventions Interventions: Breast feeding basics reviewed;Education;Hand express  Discharge WIC Program: Yes  Consult Status Consult Status: Follow-up from L&D Date: 04/10/21 Follow-up type: In-patient    Katelyn Maina  White 04/09/2021, 10:17 PM

## 2021-04-10 MED ORDER — INFLUENZA VAC SPLIT QUAD 0.5 ML IM SUSY
0.5000 mL | PREFILLED_SYRINGE | INTRAMUSCULAR | Status: AC
  Administered 2021-04-11: 0.5 mL via INTRAMUSCULAR
  Filled 2021-04-10: qty 0.5

## 2021-04-10 MED ORDER — SIMETHICONE 80 MG PO CHEW: 80.0000 mg | CHEWABLE_TABLET | ORAL | Status: DC | PRN

## 2021-04-10 MED ORDER — BENZOCAINE-MENTHOL 20-0.5 % EX AERO: 1.0000 "application " | INHALATION_SPRAY | CUTANEOUS | Status: DC | PRN

## 2021-04-10 MED ORDER — DIBUCAINE (PERIANAL) 1 % EX OINT: 1.0000 "application " | TOPICAL_OINTMENT | CUTANEOUS | Status: DC | PRN

## 2021-04-10 MED ORDER — TETANUS-DIPHTH-ACELL PERTUSSIS 5-2.5-18.5 LF-MCG/0.5 IM SUSY: 0.5000 mL | PREFILLED_SYRINGE | Freq: Once | INTRAMUSCULAR | Status: DC

## 2021-04-10 MED ORDER — ONDANSETRON HCL 4 MG PO TABS: 4.0000 mg | ORAL_TABLET | ORAL | Status: DC | PRN

## 2021-04-10 MED ORDER — ONDANSETRON HCL 4 MG/2ML IJ SOLN: 4.0000 mg | INTRAMUSCULAR | Status: DC | PRN

## 2021-04-10 MED ORDER — MEASLES, MUMPS & RUBELLA VAC IJ SOLR: 0.5000 mL | Freq: Once | INTRAMUSCULAR | Status: DC

## 2021-04-10 MED ORDER — WITCH HAZEL-GLYCERIN EX PADS: 1.0000 "application " | MEDICATED_PAD | CUTANEOUS | Status: DC | PRN

## 2021-04-10 MED ORDER — COCONUT OIL OIL: 1.0000 "application " | TOPICAL_OIL | Status: DC | PRN

## 2021-04-10 MED ORDER — IBUPROFEN 600 MG PO TABS
600.0000 mg | ORAL_TABLET | Freq: Four times a day (QID) | ORAL | Status: DC
  Administered 2021-04-10 – 2021-04-11 (×6): 600 mg via ORAL
  Filled 2021-04-10 (×6): qty 1

## 2021-04-10 MED ORDER — SENNOSIDES-DOCUSATE SODIUM 8.6-50 MG PO TABS
2.0000 | ORAL_TABLET | Freq: Every day | ORAL | Status: DC
  Administered 2021-04-10 – 2021-04-11 (×2): 2 via ORAL
  Filled 2021-04-10 (×2): qty 2

## 2021-04-10 MED ORDER — NIFEDIPINE ER OSMOTIC RELEASE 30 MG PO TB24
30.0000 mg | ORAL_TABLET | Freq: Every day | ORAL | Status: DC
  Administered 2021-04-10 – 2021-04-11 (×2): 30 mg via ORAL
  Filled 2021-04-10 (×2): qty 1

## 2021-04-10 MED ORDER — MEDROXYPROGESTERONE ACETATE 150 MG/ML IM SUSP: 150.0000 mg | INTRAMUSCULAR | Status: DC | PRN

## 2021-04-10 MED ORDER — DIPHENHYDRAMINE HCL 25 MG PO CAPS: 25.0000 mg | ORAL_CAPSULE | Freq: Four times a day (QID) | ORAL | Status: DC | PRN

## 2021-04-10 MED ORDER — PRENATAL MULTIVITAMIN CH
1.0000 | ORAL_TABLET | Freq: Every day | ORAL | Status: DC
  Administered 2021-04-10 – 2021-04-11 (×2): 1 via ORAL
  Filled 2021-04-10 (×2): qty 1

## 2021-04-10 MED ORDER — ACETAMINOPHEN 325 MG PO TABS: 650.0000 mg | ORAL_TABLET | ORAL | Status: DC | PRN

## 2021-04-10 NOTE — Lactation Note (Signed)
This note was copied from a baby's chart. Lactation Consultation Note  Patient Name: Katelyn White [id number] Date: 04/10/2021 Reason for consult: Initial assessment Age:23 hours  P1, Mother giving baby bottle of formula upon entering. Mother states she wants to breastfeed and sometimes give formula.  Discussed supply and demand. Suggest offering breast before formula. Mother states she has volume guidelines. Encouraged mother to call next time baby cues to assist with latching. Feed on demand with cues.  Goal 8-12+ times per day after first 24 hrs.  Place baby STS if not cueing.  Mom made aware of O/P services, breastfeeding support groups, community resources, and our phone # for post-discharge questions.    Maternal Data Has patient been taught Hand Expression?: Yes Does the patient have breastfeeding experience prior to this delivery?: No  Feeding Mother's Current Feeding Choice: Breast Milk and Formula Nipple Type: Slow - flow  Interventions Interventions: Education;LC Services brochure  Consult Status Consult Status: Follow-up Date: 04/11/21 Follow-up type: In-patient    Dahlia Byes [facility] 04/10/2021, 11:08 AM

## 2021-04-10 NOTE — Anesthesia Postprocedure Evaluation (Signed)
Anesthesia Post Note  Patient: Katelyn White  Procedure(s) Performed: AN AD HOC LABOR EPIDURAL     Patient location during evaluation: Mother Baby Anesthesia Type: Epidural Level of consciousness: awake and alert Pain management: pain level controlled Vital Signs Assessment: post-procedure vital signs reviewed and stable Respiratory status: spontaneous breathing, nonlabored ventilation and respiratory function stable Cardiovascular status: stable Postop Assessment: no headache, no backache, epidural receding, no apparent nausea or vomiting, patient able to bend at knees, adequate PO intake and able to ambulate Anesthetic complications: no   No notable events documented.  Last Vitals:  Vitals:   04/10/21 0045 04/10/21 0416  BP: 138/86 136/81  Pulse: 98 (!) 111  Resp: 18   Temp: 36.8 C   SpO2: 100% 98%    Last Pain:  Vitals:   04/10/21 0416  TempSrc:   PainSc: 0-No pain   Pain Goal:                   Laban Emperor

## 2021-04-10 NOTE — Progress Notes (Addendum)
POSTPARTUM PROGRESS NOTE  Post Partum Day 1  Subjective:  Katelyn White is a 20 y.o. G1P1001 s/p VD at [redacted]w[redacted]d.  She reports she is doing well. No acute events overnight. She denies any problems with ambulating, voiding or po intake. Denies nausea or vomiting.  Pain is moderately controlled.  Lochia is minimal.  Objective: Blood pressure 136/81, pulse (!) 111, temperature 98.2 F (36.8 C), temperature source Oral, resp. rate 18, SpO2 98 %, unknown if currently breastfeeding.  Physical Exam:  General: alert, cooperative and no distress Chest: no respiratory distress Heart: distal pulses intact Uterine Fundus: firm, appropriately tender DVT Evaluation: No calf swelling or tenderness Extremities: No LE edema Skin: warm, dry  Recent Labs    04/09/21 0018  HGB 11.8*  HCT 37.6    Assessment/Plan: Katelyn White is a 20 y.o. G1P1001 s/p VD at [redacted]w[redacted]d   PPD#1 - Doing well  Routine postpartum care  gHTN -Start Procadia 30mg  -Continue to monitor BP  Contraception: Unsure Feeding: Both Circumcision: Desires, consented at bedside   Dispo: Plan for discharge 10/13.   LOS: 1 day   11/13, DO 04/10/2021, 6:26 AM PGY-3, [facility]  GME ATTESTATION:  I saw and evaluated the patient. I agree with the findings and the plan of care as documented in the resident's note. I have made changes to documentation as necessary.  06/10/2021, MD OB Fellow, Faculty [facility], Center for [facility] 04/10/2021 8:58 AM

## 2021-04-11 ENCOUNTER — Other Ambulatory Visit (HOSPITAL_COMMUNITY): Payer: Self-pay

## 2021-04-11 MED ORDER — ACETAMINOPHEN 500 MG PO TABS
1000.0000 mg | ORAL_TABLET | Freq: Three times a day (TID) | ORAL | 0 refills | Status: DC | PRN
  Filled 2021-04-11: qty 60, 10d supply, fill #0

## 2021-04-11 MED ORDER — NIFEDIPINE ER 30 MG PO TB24
30.0000 mg | ORAL_TABLET | Freq: Every day | ORAL | 0 refills | Status: DC
  Filled 2021-04-11: qty 30, 30d supply, fill #0

## 2021-04-11 MED ORDER — IBUPROFEN 600 MG PO TABS
600.0000 mg | ORAL_TABLET | Freq: Four times a day (QID) | ORAL | 0 refills | Status: DC | PRN
  Filled 2021-04-11: qty 40, 10d supply, fill #0

## 2021-04-12 ENCOUNTER — Inpatient Hospital Stay (HOSPITAL_COMMUNITY): Admit: 2021-04-12 | Payer: Self-pay

## 2021-04-15 ENCOUNTER — Encounter: Payer: Self-pay | Admitting: Family Medicine

## 2021-04-16 ENCOUNTER — Ambulatory Visit: Payer: Medicaid - Out of State

## 2021-04-17 ENCOUNTER — Other Ambulatory Visit: Payer: Self-pay

## 2021-04-17 ENCOUNTER — Ambulatory Visit (INDEPENDENT_AMBULATORY_CARE_PROVIDER_SITE_OTHER): Payer: Medicaid Other

## 2021-04-17 DIAGNOSIS — Z0130 Encounter for examination of blood pressure without abnormal findings: Secondary | ICD-10-CM

## 2021-04-17 NOTE — Progress Notes (Signed)
  Subjective:  Katelyn White is a 20 y.o. female here for BP check.   Hypertension ROS: taking medications as instructed, no medication side effects noted, no TIA's, no chest pain on exertion, no dyspnea on exertion, and no swelling of ankles.    Objective:  BP 123/82 (BP Location: Left Arm, Patient Position: Sitting, Cuff Size: Large)   Pulse (!) 106   Temp (!) 97.5 F (36.4 C) (Oral)   Breastfeeding Unknown   Appearance alert, well appearing, and in no distress, oriented to person, place, and time, and normal appearing weight. General exam BP noted to be well controlled today in office.    Assessment:   Blood Pressure  to be monitored at home .   Plan:  Follow up: 5 weeks and as needed. Encouraged pt to monitor Bp at home, 30 mins after taking Bp meds daily, and to keep a log. Also encouraged to go to MAU if any symptoms of HTN of appeared.  Pt will f/u for postpartum apt with Edd Arbour, CNM on 05/08/2021  Gearldine Shown, CMA

## 2021-04-22 ENCOUNTER — Telehealth (HOSPITAL_COMMUNITY): Payer: Self-pay | Admitting: *Deleted

## 2021-04-22 NOTE — Telephone Encounter (Signed)
Patient voiced no questions or concerns regarding her own health. EPDS = 9. RN offered to email list of local maternal mental health resources - patient declined, stating, "I don't think there's anything wrong with me." Patient voiced no questions or concerns regarding baby at this time. Patient reported infant sleeps in a bassinet or in her bed on his back. RN reviewed ABCs of safe sleep - patient verbalized understanding. Patient requested RN email information on hospital's virtual postpartum classes and support groups. Email sent. Deforest Hoyles, RN, 04/22/21, [phone].

## 2021-05-08 ENCOUNTER — Ambulatory Visit (INDEPENDENT_AMBULATORY_CARE_PROVIDER_SITE_OTHER): Payer: Medicaid Other | Admitting: Certified Nurse Midwife

## 2021-05-08 ENCOUNTER — Other Ambulatory Visit: Payer: Self-pay

## 2021-05-08 DIAGNOSIS — Z3009 Encounter for other general counseling and advice on contraception: Secondary | ICD-10-CM

## 2021-05-08 NOTE — Progress Notes (Signed)
Post Partum Visit Note  Katelyn White is a 20 y.o. G35P1001 female who presents for a postpartum visit. She is 4 weeks postpartum following a normal spontaneous vaginal delivery.  I have fully reviewed the prenatal and intrapartum course. The delivery was at [redacted]w[redacted]d gestational weeks.  Anesthesia: epidural. Postpartum course has been uncomplicated. Baby is doing well. Baby is feeding by bottle Rush Barer Soy , pt had questions about re-inducing lactation. Bleeding staining only. Bowel function is normal. Bladder function is normal. Patient is not sexually active. Contraception method is none. Postpartum depression screening: negative: score 5.  Upstream - 05/09/21 1424       Pregnancy Intention Screening   Does the patient want to become pregnant in the next year? No    Does the patient's partner want to become pregnant in the next year? No    Would the patient like to discuss contraceptive options today? Yes      Contraception Wrap Up   Current Method Abstinence    End Method Female Condom            The pregnancy intention screening data noted above was reviewed. Potential methods of contraception were discussed. The patient elected to proceed with Female Condom.   Edinburgh Postnatal Depression Scale - 05/08/21 1003       Edinburgh Postnatal Depression Scale:  In the Past 7 Days   I have been able to laugh and see the funny side of things. 0    I have looked forward with enjoyment to things. 0    I have blamed myself unnecessarily when things went wrong. 1    I have been anxious or worried for no good reason. 2    I have felt scared or panicky for no good reason. 2    Things have been getting on top of me. 0    I have been so unhappy that I have had difficulty sleeping. 0    I have felt sad or miserable. 0    I have been so unhappy that I have been crying. 0    The thought of harming myself has occurred to me. 0    Edinburgh Postnatal Depression Scale Total 5             Health Maintenance Due  Topic Date Due   HPV VACCINES (1 - 2-dose series) Never done   Hepatitis C Screening  Never done   CHLAMYDIA SCREENING  06/19/2019   PAP-Cervical Cytology Screening  Never done   PAP SMEAR-Modifier  Never done   COVID-19 Vaccine (3 - Booster for Moderna series) 01/01/2021   The following portions of the patient's history were reviewed and updated as appropriate: allergies, current medications, past family history, past medical history, past social history, past surgical history, and problem list.  Review of Systems Pertinent items noted in HPI and remainder of comprehensive ROS otherwise negative.  Objective:  BP 132/86 (BP Location: Left Arm, Patient Position: Sitting, Cuff Size: Normal)   Pulse 88   Temp 98.1 F (36.7 C) (Oral)   Ht 5\' 4"  (1.626 m)   Wt 190 lb 6.4 oz (86.4 kg)   Breastfeeding No   BMI 32.68 kg/m    Constitutional: Well-developed, well-nourished female in no acute distress.  HEENT: PERRLA Skin: normal color and turgor, no rash Cardiovascular: normal rate & rhythm Respiratory: normal effort GI: Abd soft, non-tender MS: Extremities nontender, no edema, normal ROM Neurologic: Alert and oriented x 4.  GU: no CVA  tenderness Pelvic: not indicated, offered to pt, she will call for exam if needed  Assessment & Plan:  Postpartum care and examination  Birth control counseling - reviewed options, pt likely wants an IUD, will call when ready.  Essential components of care per ACOG recommendations:  1.  Mood and well being: Patient with negative depression screening today. Reviewed local resources for support.  - Patient tobacco use? No.   - hx of drug use? No.    2. Infant care and feeding:  -Patient currently breastmilk feeding? No.  -Social determinants of health (SDOH) reviewed in EPIC. No concerns  3. Sexuality, contraception and birth spacing - Patient does not want a pregnancy in the next year.  Desired family size is unknown  at this time. - Reviewed forms of contraception in tiered fashion. Patient desired  nothing  today, will call when ready for her IUD.   - Discussed birth spacing of 18 months  4. Sleep and fatigue -Encouraged family/partner/community support of 4 hrs of uninterrupted sleep to help with mood and fatigue  5. Physical Recovery  - Discussed patients delivery and complications. She describes her labor as good. - Patient had a Vaginal, no problems at delivery. Patient had a  first degree and bilateral periurethral  laceration. Perineal healing reviewed. Pt is still having some discomfort around her urethra but no issues urinating and reports consistent improvement. Declined physical exam today but encouraged to call if this does not resolve in the next 2-3 weeks. Patient expressed understanding - Patient has urinary incontinence? No. - Patient is safe to resume physical and sexual activity when ready  6.  Health Maintenance - HM due items addressed Yes - Has not had a pap smear yet, needs to be completed but declined today. Pap smear not done at today's visit.  -Breast Cancer screening indicated? No.   7. Chronic Disease/Pregnancy Condition follow up: None - PCP follow up as needed  Bernerd Limbo, CNM [facility] [employer]

## 2021-10-12 ENCOUNTER — Inpatient Hospital Stay (HOSPITAL_COMMUNITY)
Admission: AD | Admit: 2021-10-12 | Discharge: 2021-10-12 | Disposition: A | Discharge status: Home / Self Care | Payer: Medicaid Other | Attending: Obstetrics & Gynecology | Admitting: Obstetrics & Gynecology

## 2021-10-12 ENCOUNTER — Encounter (HOSPITAL_COMMUNITY): Payer: Self-pay | Admitting: Obstetrics & Gynecology

## 2021-10-12 ENCOUNTER — Inpatient Hospital Stay (HOSPITAL_COMMUNITY): Payer: Medicaid Other

## 2021-10-12 ENCOUNTER — Other Ambulatory Visit: Payer: Self-pay

## 2021-10-12 DIAGNOSIS — Z792 Long term (current) use of antibiotics: Secondary | ICD-10-CM | POA: Diagnosis not present

## 2021-10-12 DIAGNOSIS — O2341 Unspecified infection of urinary tract in pregnancy, first trimester: Secondary | ICD-10-CM | POA: Diagnosis present

## 2021-10-12 DIAGNOSIS — Z3A01 Less than 8 weeks gestation of pregnancy: Secondary | ICD-10-CM

## 2021-10-12 DIAGNOSIS — Z3491 Encounter for supervision of normal pregnancy, unspecified, first trimester: Secondary | ICD-10-CM

## 2021-10-12 LAB — URINALYSIS, ROUTINE W REFLEX MICROSCOPIC
Bacteria, UA: NONE SEEN
Bilirubin Urine: NEGATIVE
Glucose, UA: NEGATIVE mg/dL
Hgb urine dipstick: NEGATIVE
Ketones, ur: NEGATIVE mg/dL
Nitrite: NEGATIVE
Protein, ur: 30 mg/dL — AB
Specific Gravity, Urine: 1.023 (ref 1.005–1.030)
WBC, UA: >50 WBC/hpf — ABNORMAL HIGH (ref 0–5)
pH: 8 (ref 5.0–8.0)

## 2021-10-12 LAB — CBC
HCT: 38.5 % (ref 36.0–46.0)
Hemoglobin: 12.7 g/dL (ref 12.0–15.0)
MCH: 26.2 pg (ref 26.0–34.0)
MCHC: 33 g/dL (ref 30.0–36.0)
MCV: 79.4 fL — ABNORMAL LOW (ref 80.0–100.0)
Platelets: 365 10*3/uL (ref 150–400)
RBC: 4.85 MIL/uL (ref 3.87–5.11)
RDW: 16.1 % — ABNORMAL HIGH (ref 11.5–15.5)
WBC: 14 10*3/uL — ABNORMAL HIGH (ref 4.0–10.5)
nRBC: 0 % (ref 0.0–0.2)

## 2021-10-12 LAB — HCG, QUANTITATIVE, PREGNANCY: hCG, Beta Chain, Quant, S: 7051 m[IU]/mL — ABNORMAL HIGH (ref <5)

## 2021-10-12 LAB — WET PREP, GENITAL
Sperm: NONE SEEN
Trich, Wet Prep: NONE SEEN
WBC, Wet Prep HPF POC: >=10 — AB (ref <10)
Yeast Wet Prep HPF POC: NONE SEEN

## 2021-10-12 LAB — POCT PREGNANCY, URINE: Preg Test, Ur: POSITIVE — AB

## 2021-10-12 MED ORDER — CEFADROXIL 500 MG PO CAPS: 500.0000 mg | ORAL_CAPSULE | Freq: Two times a day (BID) | ORAL | 0 refills | Status: AC

## 2021-10-12 NOTE — MAU Note (Signed)
Pt reports to mau with c/o lower abd pain that started 2 days ago.  Pt reports pos hpt yesterday.  Denies vag bleeding. ?

## 2021-10-12 NOTE — Discharge Instructions (Signed)
  [facility] Providers          [facility]  [address], [place], [place] [address]  [phone]  [facility]  [address], [place], [place] [address]  [phone]  [facility]  [address], [place], [place] [address]  [phone]  [facility] [place]  [address], [place], [place] [address]  [phone]  [facility]  [address] floor), [place], [place] [address]  [phone]  [facility]  [address], [place], [place] [address]  [phone]  [facility]  [address], [place], [place] [address]  [phone]  [facility]  [address], [place], [place] [address]  [phone]  [facility]  [address], [place], [place] [address]  [phone]  [facility]  [address], [place], [place] [address]  [phone]  [facility]  [address], [place], [place] [address]  [phone]  [facility]   [address], [place], [place] [address]  [phone]  [facility]  [address], [place], [place] [address]   [phone]  [facility]  [address], [place], [place] [address]  [phone]         

## 2021-10-12 NOTE — MAU Provider Note (Signed)
?History  ?  ? ?[id number] ? ?Arrival date and time: 10/12/21 1633 ?  ? ?Chief Complaint  ?Patient presents with  ? Abdominal Pain  ? ? ? ?HPI ?Katelyn White is a 21 y.o. at [redacted]w[redacted]d by LMP who presents for abdominal cramping. ?Symptoms started 3 days ago. Reports intermittent cramping in lower abdomen. Also has had dysuria. Symptoms similar to previous urinary tract infections. Had a positive pregnancy test at home this morning.  ?Denies fever, nausea, vomiting, hematuria, vaginal bleeding, or vaginal discharge.   ? ?OB History   ? ? Gravida  ?2  ? Para  ?1  ? Term  ?1  ? Preterm  ?0  ? AB  ?0  ? Living  ?1  ?  ? ? SAB  ?0  ? IAB  ?0  ? Ectopic  ?0  ? Multiple  ?0  ? Live Births  ?1  ?   ?  ?  ? ? ?Past Medical History:  ?Diagnosis Date  ? Headache   ? Infection   ? UTI  ? ? ?Past Surgical History:  ?Procedure Laterality Date  ? NO PAST SURGERIES    ? ? ?Family History  ?Problem Relation Age of Onset  ? Diabetes Mother   ? Stroke Father   ? Hypertension Father   ? ? ?No Known Allergies ? ?No current facility-administered medications on file prior to encounter.  ? ?No current outpatient medications on file prior to encounter.  ? ? ? ?ROS ?Pertinent positives and negative per HPI, all others reviewed and negative ? ?Physical Exam  ? ?BP 131/77   Pulse 97   Temp 98.1 ?F (36.7 ?C) (Oral)   Resp 15   LMP 09/02/2021 (Exact Date)   SpO2 99%  ? ?Patient Vitals for the past 24 hrs: ? BP Temp Temp src Pulse Resp SpO2  ?10/12/21 1712 131/77 -- -- 97 -- --  ?10/12/21 1656 137/70 98.1 ?F (36.7 ?C) Oral 93 15 99 %  ? ? ?Physical Exam ?Vitals and nursing note reviewed.  ?Constitutional:   ?   General: She is not in acute distress. ?   Appearance: She is well-developed. She is not ill-appearing.  ?HENT:  ?   Head: Normocephalic and atraumatic.  ?Pulmonary:  ?   Effort: Pulmonary effort is normal. No respiratory distress.  ?Abdominal:  ?   General: Abdomen is flat.  ?   Palpations: Abdomen is soft.  ?   Tenderness: There is no  abdominal tenderness. There is no right CVA tenderness, left CVA tenderness or rebound.  ?Skin: ?   General: Skin is warm and dry.  ?Neurological:  ?   Mental Status: She is alert.  ?Psychiatric:     ?   Mood and Affect: Mood normal.     ?   Behavior: Behavior normal.  ?  ? ? ?Labs ?Results for orders placed or performed during the hospital encounter of 10/12/21 (from the past 24 hour(s))  ?Pregnancy, urine POC     Status: Abnormal  ? Collection Time: 10/12/21  4:48 PM  ?Result Value Ref Range  ? Preg Test, Ur POSITIVE (A) NEGATIVE  ?Urinalysis, Routine w reflex microscopic Urine, Clean Catch     Status: Abnormal  ? Collection Time: 10/12/21  4:52 PM  ?Result Value Ref Range  ? Color, Urine YELLOW YELLOW  ? APPearance CLOUDY (A) CLEAR  ? Specific Gravity, Urine 1.023 1.005 - 1.030  ? pH 8.0 5.0 - 8.0  ? Glucose, UA NEGATIVE  NEGATIVE mg/dL  ? Hgb urine dipstick NEGATIVE NEGATIVE  ? Bilirubin Urine NEGATIVE NEGATIVE  ? Ketones, ur NEGATIVE NEGATIVE mg/dL  ? Protein, ur 30 (A) NEGATIVE mg/dL  ? Nitrite NEGATIVE NEGATIVE  ? Leukocytes,Ua LARGE (A) NEGATIVE  ? RBC / HPF 0-5 0 - 5 RBC/hpf  ? WBC, UA >50 (H) 0 - 5 WBC/hpf  ? Bacteria, UA NONE SEEN NONE SEEN  ? Squamous Epithelial / LPF 21-50 0 - 5  ? Mucus PRESENT   ?CBC     Status: Abnormal  ? Collection Time: 10/12/21  5:46 PM  ?Result Value Ref Range  ? WBC 14.0 (H) 4.0 - 10.5 K/uL  ? RBC 4.85 3.87 - 5.11 MIL/uL  ? Hemoglobin 12.7 12.0 - 15.0 g/dL  ? HCT 38.5 36.0 - 46.0 %  ? MCV 79.4 (L) 80.0 - 100.0 fL  ? MCH 26.2 26.0 - 34.0 pg  ? MCHC 33.0 30.0 - 36.0 g/dL  ? RDW 16.1 (H) 11.5 - 15.5 %  ? Platelets 365 150 - 400 K/uL  ? nRBC 0.0 0.0 - 0.2 %  ?ABO/Rh     Status: None  ? Collection Time: 10/12/21  5:46 PM  ?Result Value Ref Range  ? ABO/RH(D)    ?  O POS ?Performed at [facility], [address], [place], [place] [address] ?  ?Wet prep, genital     Status: Abnormal  ? Collection Time: 10/12/21  5:49 PM  ? Specimen: PATH Cytology Cervicovaginal Ancillary Only   ?Result Value Ref Range  ? Yeast Wet Prep HPF POC NONE SEEN NONE SEEN  ? Trich, Wet Prep NONE SEEN NONE SEEN  ? Clue Cells Wet Prep HPF POC PRESENT (A) NONE SEEN  ? WBC, Wet Prep HPF POC >=10 (A) <10  ? Sperm NONE SEEN   ? ? ?Imaging ?US OB LESS THAN 14 WEEKS WITH OB TRANSVAGINAL ? ?Result Date: 10/12/2021 ?CLINICAL DATA:  Midline cramping.  Positive pregnancy test. EXAM: OBSTETRIC <14 WK [place] AND TRANSVAGINAL OB US TECHNIQUE: Both transabdominal and transvaginal ultrasound examinations were performed for complete evaluation of the gestation as well as the maternal uterus, adnexal regions, and pelvic cul-de-sac. Transvaginal technique was performed to assess early pregnancy. COMPARISON:  None. FINDINGS: Intrauterine gestational sac: Single Yolk sac:  Present Embryo:  Not visualized Cardiac Activity: Not visualized MSD: 7.8 mm   5 w   3 d Subchorionic hemorrhage:  None visualized. Maternal uterus/adnexae: Within normal limits. Corpus luteal cyst evident within the right ovary. IMPRESSION: 1. Single intrauterine pregnancy estimated gestational age of [redacted] weeks and 3 days based on mean sac diameter. 2. Visualized yolk sac. Embryo and cardiac activity not visualized, within normal limits for gestational age. Electronically Signed   By: Marin Roberts M.D.   On: 10/12/2021 18:25   ? ?MAU Course  ?Procedures ?Lab Orders    ?     Wet prep, genital    ?     Culture, OB Urine    ?     Urinalysis, Routine w reflex microscopic Urine, Clean Catch    ?     CBC    ?     hCG, quantitative, pregnancy    ?     Pregnancy, urine POC    ? ?Meds ordered this encounter  ?Medications  ? cefadroxil (DURICEF) 500 MG capsule  ?  Sig: Take 1 capsule (500 mg total) by mouth 2 (two) times daily for 7 days.  ?  Dispense:  14  capsule  ?  Refill:  0  ?  Order Specific Question:   Supervising Provider  ?  AnswerMyna Hidalgo:   OZAN, JENNIFER [[id number]]  ? ?Imaging Orders    ?     US OB LESS THAN 14 WEEKS WITH OB TRANSVAGINAL    ? ? ?MDM ?+UPT ?UA, wet prep,  GC/chlamydia, CBC, ABO/Rh, quant hCG, and US today to rule out ectopic pregnancy which can be life threatening.  ? ?U/a shows moderate leuks. Patient endorses dysuria & reports symptoms similar to previous UTIs. Will treat with antibiotics & send urine for culture. She is afebrile & has no CVA tenderness.  ? ?Ultrasound shows intrauterine gestational sac with yolk sac.  ?Assessment and Plan  ? ?1. Urinary tract infection in mother during first trimester of pregnancy  ?-Rx duricef ?-Urine culture pending  ?2. Normal IUP (intrauterine pregnancy) on prenatal ultrasound, first trimester  ?-Start prenatal care - given list of OBs  ?3. [redacted] weeks gestation of pregnancy   ? ? ? ?Judeth HornErin Sherrin Stahle, NP ?10/12/21 ?6:41 PM ? ? ?

## 2021-10-13 LAB — ABO/RH: ABO/RH(D): O POS

## 2021-10-14 LAB — GC/CHLAMYDIA PROBE AMP (~~LOC~~) NOT AT ARMC
Chlamydia: NEGATIVE
Comment: NEGATIVE
Comment: NORMAL
Neisseria Gonorrhea: NEGATIVE

## 2021-10-15 LAB — CULTURE, OB URINE: Culture: >=100000 — AB

## 2021-10-23 ENCOUNTER — Other Ambulatory Visit: Payer: Self-pay

## 2021-10-23 ENCOUNTER — Inpatient Hospital Stay (HOSPITAL_COMMUNITY)
Admission: AD | Admit: 2021-10-23 | Discharge: 2021-10-23 | Disposition: A | Discharge status: Home / Self Care | Payer: Medicaid Other | Attending: Obstetrics and Gynecology | Admitting: Obstetrics and Gynecology

## 2021-10-23 DIAGNOSIS — Z711 Person with feared health complaint in whom no diagnosis is made: Secondary | ICD-10-CM | POA: Insufficient documentation

## 2021-10-23 DIAGNOSIS — R030 Elevated blood-pressure reading, without diagnosis of hypertension: Secondary | ICD-10-CM | POA: Insufficient documentation

## 2021-10-23 LAB — URINALYSIS, ROUTINE W REFLEX MICROSCOPIC
Bacteria, UA: NONE SEEN
Bilirubin Urine: NEGATIVE
Glucose, UA: NEGATIVE mg/dL
Hgb urine dipstick: NEGATIVE
Ketones, ur: NEGATIVE mg/dL
Nitrite: NEGATIVE
Protein, ur: NEGATIVE mg/dL
Specific Gravity, Urine: 1.011 (ref 1.005–1.030)
WBC, UA: >50 WBC/hpf — ABNORMAL HIGH (ref 0–5)
pH: 6 (ref 5.0–8.0)

## 2021-10-23 NOTE — MAU Note (Signed)
.  Katelyn White is a 21 y.o. at [redacted]w[redacted]d here in MAU reporting: difficulty catching her breath which she had in her last pregnancy. She ambulated to Triage without difficulty. Thalia Bloodgood CNM came into Triage to see pt. Plan of care discussed with pt and pt d/c home from Triage.  ?LMP: 06/09/22 ?Onset of complaint: on going for a long time even with first pregnancy. Has been worse today ?Pain score: 0 ?Vitals:  ? 10/23/21 0127  ?Pulse: 64  ?Resp: 17  ?Temp: 97.8 ?F (36.6 ?C)  ?SpO2: 100%  ?   ?FHT:n/a at [redacted]w[redacted]d ?Lab orders placed from triage:   ? ?

## 2021-10-23 NOTE — MAU Provider Note (Signed)
Event Date/Time  ?First Provider Initiated Contact with Patient 10/23/21 0127   ?  ?S ?Katelyn White is a 21 y.o. G2P1001 patient who presents to MAU today with complaint of difficulty catching her breath. This is a recurrent problem, onset during patient's first pregnancy in 2022. Patient states sometimes she feels winded. She denies chest pain, activity intolerance, weakness, syncope. Patient denies hx of asthma. She denies difficulty speaking in full sentences. When asked what made her seek treatment tonight in MAU patient responded "I don't know. I was hoping to figure out where its coming from". She denies abdominal pain, vaginal bleeding, dysuria, fever or recent illness. ? ?O ?BP 125/72   Pulse 64   Temp 97.8 ?F (36.6 ?C)   Resp 17   Ht 5\' 4"  (1.626 m)   Wt 88.9 kg   LMP 09/02/2021 (Exact Date)   SpO2 100%   BMI 33.64 kg/m?   ? ?Physical Exam ?Vitals and nursing note reviewed.  ?Constitutional:   ?   Appearance: She is well-developed. She is obese. She is not ill-appearing.  ?Cardiovascular:  ?   Rate and Rhythm: Normal rate and regular rhythm.  ?   Pulses: Normal pulses.  ?   Heart sounds: Normal heart sounds.  ?Pulmonary:  ?   Effort: Pulmonary effort is normal.  ?   Breath sounds: Normal breath sounds. No decreased breath sounds.  ?   Comments: No evidence of increased WOB, Lungs CTAB, patient speaking rapidly and in full sentences without signs of air hunger.  ?Skin: ?   Capillary Refill: Capillary refill takes less than 2 seconds.  ?Neurological:  ?   Mental Status: She is alert and oriented to person, place, and time.  ?Psychiatric:     ?   Mood and Affect: Mood normal.     ?   Behavior: Behavior normal.  ? ?A ?Medical screening exam complete ?Discussed acute respiratory distress vs prolonged history of feeling winded ?Patient offered and declined assessment for dehydration, anemia ?IUP confirmed 10/12/2021, Hgb 12.7 during that MAU encounter ?Discussed pregnancy-safe medications and  treatments for congestion, seasonal allergies ? ?P ?Discharge from MAU in stable condition ?Warning signs for worsening condition that would warrant emergency follow-up discussed ?Patient may return to MAU as needed  ? ?10/14/2021, Calvert Cantor ?10/23/2021 2:57 AM  ? ?

## 2021-10-23 NOTE — Progress Notes (Signed)
Written and verbal d/c instructions given and understanding voiced. 

## 2021-10-23 NOTE — Discharge Instructions (Signed)

## 2021-10-30 ENCOUNTER — Inpatient Hospital Stay (HOSPITAL_COMMUNITY): Payer: Medicaid Other

## 2021-10-30 ENCOUNTER — Inpatient Hospital Stay (HOSPITAL_COMMUNITY)
Admission: AD | Admit: 2021-10-30 | Discharge: 2021-10-30 | Disposition: A | Discharge status: Home / Self Care | Payer: Medicaid Other | Attending: Obstetrics & Gynecology | Admitting: Obstetrics & Gynecology

## 2021-10-30 ENCOUNTER — Encounter (HOSPITAL_COMMUNITY): Payer: Self-pay | Admitting: Obstetrics & Gynecology

## 2021-10-30 DIAGNOSIS — N3001 Acute cystitis with hematuria: Secondary | ICD-10-CM | POA: Diagnosis not present

## 2021-10-30 DIAGNOSIS — O2311 Infections of bladder in pregnancy, first trimester: Secondary | ICD-10-CM | POA: Diagnosis not present

## 2021-10-30 DIAGNOSIS — O30001 Twin pregnancy, unspecified number of placenta and unspecified number of amniotic sacs, first trimester: Secondary | ICD-10-CM

## 2021-10-30 DIAGNOSIS — Z87891 Personal history of nicotine dependence: Secondary | ICD-10-CM | POA: Insufficient documentation

## 2021-10-30 DIAGNOSIS — Z3A08 8 weeks gestation of pregnancy: Secondary | ICD-10-CM | POA: Diagnosis not present

## 2021-10-30 DIAGNOSIS — O2341 Unspecified infection of urinary tract in pregnancy, first trimester: Secondary | ICD-10-CM | POA: Diagnosis not present

## 2021-10-30 DIAGNOSIS — R109 Unspecified abdominal pain: Secondary | ICD-10-CM | POA: Insufficient documentation

## 2021-10-30 LAB — CBC WITH DIFFERENTIAL/PLATELET
Abs Immature Granulocytes: 0.06 10*3/uL (ref 0.00–0.07)
Basophils Absolute: 0 10*3/uL (ref 0.0–0.1)
Basophils Relative: 0 %
Eosinophils Absolute: 0 10*3/uL (ref 0.0–0.5)
Eosinophils Relative: 0 %
HCT: 38.4 % (ref 36.0–46.0)
Hemoglobin: 12.4 g/dL (ref 12.0–15.0)
Immature Granulocytes: 0 %
Lymphocytes Relative: 10 %
Lymphs Abs: 1.5 10*3/uL (ref 0.7–4.0)
MCH: 26.2 pg (ref 26.0–34.0)
MCHC: 32.3 g/dL (ref 30.0–36.0)
MCV: 81 fL (ref 80.0–100.0)
Monocytes Absolute: 0.4 10*3/uL (ref 0.1–1.0)
Monocytes Relative: 3 %
Neutro Abs: 13.1 10*3/uL — ABNORMAL HIGH (ref 1.7–7.7)
Neutrophils Relative %: 87 %
Platelets: 302 10*3/uL (ref 150–400)
RBC: 4.74 MIL/uL (ref 3.87–5.11)
RDW: 15.1 % (ref 11.5–15.5)
WBC: 15.1 10*3/uL — ABNORMAL HIGH (ref 4.0–10.5)
nRBC: 0 % (ref 0.0–0.2)

## 2021-10-30 LAB — URINALYSIS, MICROSCOPIC (REFLEX)

## 2021-10-30 LAB — URINALYSIS, ROUTINE W REFLEX MICROSCOPIC
Bilirubin Urine: NEGATIVE
Glucose, UA: NEGATIVE mg/dL
Ketones, ur: NEGATIVE mg/dL
Nitrite: NEGATIVE
Protein, ur: >300 mg/dL — AB
Specific Gravity, Urine: >1.03 — ABNORMAL HIGH (ref 1.005–1.030)
pH: 6.5 (ref 5.0–8.0)

## 2021-10-30 MED ORDER — SODIUM CHLORIDE 0.9 % IV SOLN
2.0000 g | Freq: Once | INTRAVENOUS | Status: AC
  Administered 2021-10-30: 2 g via INTRAVENOUS
  Filled 2021-10-30: qty 20

## 2021-10-30 MED ORDER — HYDROMORPHONE HCL 1 MG/ML IJ SOLN
1.0000 mg | Freq: Once | INTRAMUSCULAR | Status: AC
  Administered 2021-10-30: 1 mg via INTRAMUSCULAR
  Filled 2021-10-30: qty 1

## 2021-10-30 MED ORDER — CEFTRIAXONE SODIUM 1 G IJ SOLR: 1.0000 g | Freq: Once | INTRAMUSCULAR | Status: DC

## 2021-10-30 MED ORDER — PHENAZOPYRIDINE HCL 100 MG PO TABS
200.0000 mg | ORAL_TABLET | Freq: Once | ORAL | Status: AC
  Administered 2021-10-30: 200 mg via ORAL
  Filled 2021-10-30: qty 2

## 2021-10-30 MED ORDER — OXYCODONE-ACETAMINOPHEN 5-325 MG PO TABS: 1.0000 | ORAL_TABLET | ORAL | 0 refills | Status: DC | PRN

## 2021-10-30 MED ORDER — LACTATED RINGERS IV BOLUS
1000.0000 mL | Freq: Once | INTRAVENOUS | Status: DC
Start: 2021-10-30 — End: 2021-10-30

## 2021-10-30 MED ORDER — SODIUM CHLORIDE 0.9 % IV SOLN: Freq: Once | INTRAVENOUS | Status: AC

## 2021-10-30 NOTE — MAU Note (Signed)
Pt reports she was given an RX for UTI 1-2 weeks ago but she went out of town so she just picked it up and took the first dose today ?

## 2021-10-30 NOTE — MAU Note (Signed)
KALLY CADDEN is a 21 y.o. at [redacted]w[redacted]d here in MAU reporting: pain in lower abd.  "Need to use the restroom, can't go". (Urinate). Started this morning.  States pain is all the time.  Denies bleeding. Sharp pain in lower abd.  Urge to pee, states peed fine this morning, then started hurting out of nowhere.  Hurts when she tries to urinate, 'nothing comes out'. Wanting to stand in triage, hurts to sit, also sharp pain, started this morning ? ?Onset of complaint: this morning ?Pain score: 10 ?Vitals:  ? 10/30/21 1010  ?BP: 135/88  ?Pulse: 97  ?Resp: 20  ?Temp: 98.2 ?F (36.8 ?C)  ?SpO2: 99%  ?   ? ?Lab orders placed from triage:  urine ?

## 2021-10-30 NOTE — MAU Provider Note (Signed)
?History  ?  ? ?CSN: [id number] ? ?Arrival date and time: 10/30/21 3267 ? ? Event Date/Time  ? First Provider Initiated Contact with Patient 10/30/21 1037   ?  ? ?Chief Complaint  ?Patient presents with  ? Abdominal Pain  ? ?HPI ? ?[name] is a 21 y.o. Female G2P1001 @ [redacted]w[redacted]d here in MAU with lower abdominal pain/ vaginal pain. The pain started this morning. She has the urge to urinate however feels like she cannot go. She was diagnosed with a UIT last week and did not fill her RX. She filled it today and took the first dose this morning (Duricef). She has no bleeding. She denies fever or flank pain.  ? ?OB History   ? ? Gravida  ?2  ? Para  ?1  ? Term  ?1  ? Preterm  ?0  ? AB  ?0  ? Living  ?1  ?  ? ? SAB  ?0  ? IAB  ?0  ? Ectopic  ?0  ? Multiple  ?0  ? Live Births  ?1  ?   ?  ?  ? ? ?Past Medical History:  ?Diagnosis Date  ? Headache   ? Infection   ? UTI  ? ? ?Past Surgical History:  ?Procedure Laterality Date  ? NO PAST SURGERIES    ? ? ?Family History  ?Problem Relation Age of Onset  ? Diabetes Mother   ? Stroke Father   ? Hypertension Father   ? ? ?Social History  ? ?Tobacco Use  ? Smoking status: Former  ?  Years: 2.00  ?  Types: Cigarettes  ?  Quit date: 06/30/2020  ?  Years since quitting: 1.3  ? Smokeless tobacco: Never  ?Vaping Use  ? Vaping Use: Former  ?Substance Use Topics  ? Alcohol use: Not Currently  ?  Comment: weekends  ? Drug use: No  ? ? ?Allergies: No Known Allergies ? ?Medications Prior to Admission  ?Medication Sig Dispense Refill Last Dose  ? Prenatal Vit-Fe Fumarate-FA (PRENATAL MULTIVITAMIN) TABS tablet Take 1 tablet by mouth daily at 12 noon.   10/29/2021  ? ?Results for orders placed or performed during the hospital encounter of 10/30/21 (from the past 48 hour(s))  ?Urinalysis, Routine w reflex microscopic Urine, Clean Catch     Status: Abnormal  ? Collection Time: 10/30/21 10:14 AM  ?Result Value Ref Range  ? Color, Urine YELLOW YELLOW  ? APPearance CLEAR CLEAR  ? Specific Gravity,  Urine >1.030 (H) 1.005 - 1.030  ? pH 6.5 5.0 - 8.0  ? Glucose, UA NEGATIVE NEGATIVE mg/dL  ? Hgb urine dipstick MODERATE (A) NEGATIVE  ? Bilirubin Urine NEGATIVE NEGATIVE  ? Ketones, ur NEGATIVE NEGATIVE mg/dL  ? Protein, ur >300 (A) NEGATIVE mg/dL  ? Nitrite NEGATIVE NEGATIVE  ? Leukocytes,Ua TRACE (A) NEGATIVE  ?  Comment: Performed at [facility], [address], [place], [place] [address]  ?Urinalysis, Microscopic (reflex)     Status: Abnormal  ? Collection Time: 10/30/21 10:14 AM  ?Result Value Ref Range  ? RBC / HPF 6-10 0 - 5 RBC/hpf  ? WBC, UA 11-20 0 - 5 WBC/hpf  ? Bacteria, UA FEW (A) NONE SEEN  ? Squamous Epithelial / LPF 0-5 0 - 5  ? Urine-Other LESS THAN 10 mL OF URINE SUBMITTED   ?  Comment: MICROSCOPIC EXAM PERFORMED ON UNCONCENTRATED URINE ?Performed at [facility], [address], [place], [place] [address] ?  ?CBC  with Differential/Platelet     Status: Abnormal  ? Collection Time: 10/30/21  2:03 PM  ?Result Value Ref Range  ? WBC 15.1 (H) 4.0 - 10.5 K/uL  ? RBC 4.74 3.87 - 5.11 MIL/uL  ? Hemoglobin 12.4 12.0 - 15.0 g/dL  ? HCT 38.4 36.0 - 46.0 %  ? MCV 81.0 80.0 - 100.0 fL  ? MCH 26.2 26.0 - 34.0 pg  ? MCHC 32.3 30.0 - 36.0 g/dL  ? RDW 15.1 11.5 - 15.5 %  ? Platelets 302 150 - 400 K/uL  ? nRBC 0.0 0.0 - 0.2 %  ? Neutrophils Relative % 87 %  ? Neutro Abs 13.1 (H) 1.7 - 7.7 K/uL  ? Lymphocytes Relative 10 %  ? Lymphs Abs 1.5 0.7 - 4.0 K/uL  ? Monocytes Relative 3 %  ? Monocytes Absolute 0.4 0.1 - 1.0 K/uL  ? Eosinophils Relative 0 %  ? Eosinophils Absolute 0.0 0.0 - 0.5 K/uL  ? Basophils Relative 0 %  ? Basophils Absolute 0.0 0.0 - 0.1 K/uL  ? Immature Granulocytes 0 %  ? Abs Immature Granulocytes 0.06 0.00 - 0.07 K/uL  ?  Comment: Performed at [facility], [address] [place], [place] [address]  ?  ?US OB Transvaginal ? ?Result Date: 10/30/2021 ?CLINICAL DATA:  Severe abdominal pain in 1st trimester pregnancy. EXAM: US OB TRANSVAGINAL TECHNIQUE: Transvaginal ultrasound was  performed for complete evaluation of the gestation as well as the maternal uterus, adnexal regions, and pelvic cul-de-sac. COMPARISON:  10/12/2021 FINDINGS: Number of IUPs:  2 Chorionicity/Amnionicity:  Monochorionic-diamniotic (thin membrane) TWIN 1 Yolk sac:  Visualized. Embryo:  Visualized. Cardiac Activity: Visualized. Heart Rate: 161 bpm CRL:  15 mm   7 w 6 d                  US EDC: 06/12/2022 TWIN 2 Yolk sac:  Visualized. Embryo:  Visualized. Cardiac Activity: Visualized. Heart Rate: 159 bpm CRL:  15 mm   7 w 5 d                  US EDC: 06/13/2022 Subchorionic hemorrhage:  None visualized. Maternal uterus/adnexae: Normal appearance of left ovary. Right ovary is not directly visualized, however no mass or abnormal fluid collection identified. IMPRESSION: Living monochorionic, diamniotic twin IUP with estimated gestational age of [redacted] weeks 6 days and US EDC of 06/12/2022. No maternal uterine or adnexal abnormality identified. Electronically Signed   By: Danae OrleansJohn A Stahl M.D.   On: 10/30/2021 13:25  ? ?US RENAL ? ?Result Date: 10/30/2021 ?CLINICAL DATA:  Abdominal pain. Urinary tract infection. 1st trimester pregnancy. EXAM: RENAL / URINARY TRACT ULTRASOUND COMPLETE COMPARISON:  None Available. FINDINGS: Right Kidney: Renal measurements: 10.5 x 4.9 x 4.6 cm = volume: 123 mL. Echogenicity within normal limits. No mass or hydronephrosis visualized. Left Kidney: Renal measurements: 10.8 x 5.8 x 5.8 cm = volume: 189 mL. Echogenicity within normal limits. No mass or hydronephrosis visualized. Bladder: Bladder is nondistended, but shows diffuse bladder wall thickening, consistent with cystitis. Other: None. IMPRESSION: Normal appearance of both kidneys. No evidence of hydronephrosis. Diffuse bladder wall thickening, consistent with cystitis. Electronically Signed   By: Danae OrleansJohn A Stahl M.D.   On: 10/30/2021 13:27    ? ?Review of Systems  ?Constitutional:  Negative for fever.  ?Gastrointestinal:  Positive for abdominal pain.   ?Genitourinary:  Positive for dysuria, frequency, pelvic pain and urgency. Negative for flank pain, vaginal bleeding and vaginal discharge.  ?Physical Exam  ? ?  Blood pressure 135/88, pulse 97, temperature 98.2 ?F (36.8 ?C), temperature source Oral, resp. rate 20, height 5\' 4"  (1.626 m), weight 94.2 kg, last menstrual period 09/02/2021, SpO2 99 %, not currently breastfeeding. ? ?Physical Exam ?Constitutional:   ?   General: She is not in acute distress. ?   Appearance: She is well-developed. She is not ill-appearing, toxic-appearing or diaphoretic.  ?HENT:  ?   Head: Normocephalic.  ?Abdominal:  ?   Tenderness: There is abdominal tenderness. There is guarding. There is no rebound.  ?Skin: ?   General: Skin is warm.  ?Neurological:  ?   Mental Status: She is alert and oriented to person, place, and time.  ? ?MAU Course  ?Procedures ?None ? ?MDM ? ?Dilaudid given for pain ?Rocephin 2 grams given. ?CBC with diff. ?Pyridium given ?Patient with improvement in symptoms and pain.  ?Discussed patient with Dr. 11/02/2021.  ? ?Assessment and Plan  ? ?A: ? ?1. UTI in pregnancy, antepartum, first trimester   ?2. Acute cystitis with hematuria   ?3. [redacted] weeks gestation of pregnancy   ?4. Monozygotic twins in first trimester   ?  ? ?P: ? ?DC home ?Strict return precautions ?Rx:  percocet #6, no refill ?Resume Duricef tomorrow. Discussed importance of finishing antibiotics ?MFM referral ?Message sent to [facility] to get patient in for care. High risk ? ?[facility] NP ?10/30/2021 ?8:00 PM ? ?

## 2021-10-31 LAB — CULTURE, OB URINE: Culture: NO GROWTH

## 2021-11-01 ENCOUNTER — Other Ambulatory Visit: Payer: Self-pay

## 2021-11-01 DIAGNOSIS — Z3480 Encounter for supervision of other normal pregnancy, unspecified trimester: Secondary | ICD-10-CM | POA: Insufficient documentation

## 2021-11-01 MED ORDER — BLOOD PRESSURE KIT DEVI: 1.0000 | 0 refills | Status: DC

## 2021-11-01 NOTE — Progress Notes (Signed)
BP Cuff sent to [facility] ?

## 2021-11-04 ENCOUNTER — Ambulatory Visit (INDEPENDENT_AMBULATORY_CARE_PROVIDER_SITE_OTHER): Payer: Medicaid Other

## 2021-11-04 DIAGNOSIS — Z3480 Encounter for supervision of other normal pregnancy, unspecified trimester: Secondary | ICD-10-CM

## 2021-11-04 NOTE — Progress Notes (Addendum)
New OB Intake ? ?I connected with  Katelyn White on 11/04/21 at  9:00 AM EDT by telephone and verified that I am speaking with the correct person using two identifiers. Nurse is located at [facility] and pt is located at HOME. ? ?I discussed the limitations, risks, security and privacy concerns of performing an evaluation and management service by telephone and the availability of in person appointments. I also discussed with the patient that there may be a patient responsible charge related to this service. The patient expressed understanding and agreed to proceed. ? ?I explained I am completing New OB Intake today. We discussed her EDD of 06/09/2022 that is based on LMP of 09/02/2021. Pt is G2/P1. I reviewed her allergies, medications, Medical/Surgical/OB history, and appropriate screenings. I informed her of [facility] services. Based on history, this is a/an  pregnancy uncomplicated .  ? ?Patient Active Problem List  ? Diagnosis Date Noted  ? Supervision of other normal pregnancy, antepartum 11/01/2021  ? Vaginal delivery 04/09/2021  ? ? ?Concerns addressed today ? ?Delivery Plans:  ?Plans to deliver at [facility].  ? ?MyChart/Babyscripts ?MyChart access verified. I explained pt will have some visits in office and some virtually. Babyscripts instructions given and order placed. Patient verifies receipt of registration text/e-mail. Account successfully created and app downloaded. ? ?Blood Pressure Cuff  ?Blood pressure cuff ordered for patient to pick-up from [employer]. Explained after first prenatal appt pt will check weekly and document in Babyscripts. ? ?Weight scale: Patient does / does not  have weight scale. Weight scale ordered for patient to pick up from [employer].  ? ?Anatomy US ?Explained first scheduled Korea will be around 19 weeks. Anatomy US scheduled for 18-21 Weeks. Pt notified to arrive 15 minutes early. ?Scheduled AFP lab only appointment if CenteringPregnancy pt for same day as anatomy US.   ? ?Labs ?Discussed Avelina Laine genetic screening with patient. Would like both Panorama and Horizon drawn at new OB visit.Also if interested in genetic testing, tell patient she will need AFP 15-21 weeks to complete genetic testing .Routine prenatal labs needed. ? ?Covid Vaccine ?Patient has covid vaccine.  ? ?Is patient a CenteringPregnancy candidate?  ?Declined ?Declined due to Declined to say ?Not a candidate due to Other DECLINED   ?  ?Is patient a Mom+Baby Combined Care candidate?  ?Declined   ? Scheduled with Mom+Baby provider  ?  ?Is patient interested in [place]?  ?No  ?Not "Interested in [facility] - Schedule next visit with CNM" on sticky note ? ?Informed patient of [facility] website  and placed link in her AVS.  ? ?Social Determinants of Health ?Food Insecurity: Patient denies food insecurity. ?WIC Referral: Patient is not interested in referral to [facility].  ?Transportation: Patient denies transportation needs. ?Childcare: Discussed no children allowed at ultrasound appointments. Offered childcare services; patient declines childcare services at this time. ? ?Send link to Pregnancy Navigators ? ? ?Placed OB Box on problem list and updated ? ?First visit review ?I reviewed new OB appt with pt. I explained she will have a pelvic exam, ob bloodwork with genetic screening, and PAP smear. Explained pt will be seen by Alysia Penna at first visit; encounter routed to appropriate provider. Explained that patient will be seen by pregnancy navigator following visit with provider. [facility] information placed in AVS.  ? ?Maretta Bees, RMA ?11/04/2021  9:15 AM  ?

## 2021-11-04 NOTE — Progress Notes (Signed)
Agree with nurses's documentation of this patient's clinic encounter.  Khalifa Knecht L, MD  

## 2021-11-13 ENCOUNTER — Telehealth: Payer: Self-pay | Admitting: Obstetrics and Gynecology

## 2021-11-13 NOTE — Telephone Encounter (Signed)
Called by Babyscripts for elevated blood pressure at 3:00 AM.  ? ?Blood pressure was 161/93 and she is [redacted] weeks pregnant.  ? ?She does not have an upcoming appointment, but I will have the office contact her in the AM to recheck the blood pressure.  ? ?Milas Hock, MD ?Attending Obstetrician & Gynecologist, Faculty Practice ?Center for [employer], [facility] ? ?

## 2021-12-11 ENCOUNTER — Encounter: Payer: Medicaid Other | Admitting: Obstetrics and Gynecology

## 2022-02-04 LAB — OB RESULTS CONSOLE HIV ANTIBODY (ROUTINE TESTING): HIV: NONREACTIVE

## 2022-02-04 LAB — OB RESULTS CONSOLE HEPATITIS B SURFACE ANTIGEN: Hepatitis B Surface Ag: NEGATIVE

## 2022-02-04 LAB — OB RESULTS CONSOLE RPR: RPR: NONREACTIVE

## 2022-02-04 LAB — HEPATITIS C ANTIBODY: HCV Ab: NEGATIVE

## 2022-02-06 ENCOUNTER — Other Ambulatory Visit: Payer: Self-pay

## 2022-02-06 ENCOUNTER — Inpatient Hospital Stay (HOSPITAL_COMMUNITY)
Admission: AD | Admit: 2022-02-06 | Discharge: 2022-02-06 | Disposition: A | Discharge status: Home / Self Care | Payer: Medicaid Other | Attending: Obstetrics and Gynecology | Admitting: Obstetrics and Gynecology

## 2022-02-06 DIAGNOSIS — O26892 Other specified pregnancy related conditions, second trimester: Secondary | ICD-10-CM | POA: Insufficient documentation

## 2022-02-06 DIAGNOSIS — M545 Low back pain, unspecified: Secondary | ICD-10-CM | POA: Diagnosis not present

## 2022-02-06 DIAGNOSIS — O24414 Gestational diabetes mellitus in pregnancy, insulin controlled: Secondary | ICD-10-CM | POA: Diagnosis not present

## 2022-02-06 DIAGNOSIS — O30032 Twin pregnancy, monochorionic/diamniotic, second trimester: Secondary | ICD-10-CM | POA: Insufficient documentation

## 2022-02-06 DIAGNOSIS — M549 Dorsalgia, unspecified: Secondary | ICD-10-CM

## 2022-02-06 DIAGNOSIS — O99891 Other specified diseases and conditions complicating pregnancy: Secondary | ICD-10-CM | POA: Diagnosis not present

## 2022-02-06 DIAGNOSIS — Z3A22 22 weeks gestation of pregnancy: Secondary | ICD-10-CM | POA: Insufficient documentation

## 2022-02-06 DIAGNOSIS — R102 Pelvic and perineal pain: Secondary | ICD-10-CM | POA: Diagnosis not present

## 2022-02-06 LAB — WET PREP, GENITAL
Clue Cells Wet Prep HPF POC: NONE SEEN
Sperm: NONE SEEN
Trich, Wet Prep: NONE SEEN
WBC, Wet Prep HPF POC: >=10 — AB (ref <10)
Yeast Wet Prep HPF POC: NONE SEEN

## 2022-02-06 LAB — URINALYSIS, ROUTINE W REFLEX MICROSCOPIC
Bilirubin Urine: NEGATIVE
Glucose, UA: NEGATIVE mg/dL
Hgb urine dipstick: NEGATIVE
Ketones, ur: 5 mg/dL — AB
Leukocytes,Ua: NEGATIVE
Nitrite: NEGATIVE
Protein, ur: 30 mg/dL — AB
Specific Gravity, Urine: 1.03 (ref 1.005–1.030)
pH: 5 (ref 5.0–8.0)

## 2022-02-06 LAB — GC/CHLAMYDIA PROBE AMP (~~LOC~~) NOT AT ARMC
Chlamydia: NEGATIVE
Comment: NEGATIVE
Comment: NORMAL
Neisseria Gonorrhea: NEGATIVE

## 2022-02-06 MED ORDER — CYCLOBENZAPRINE HCL 10 MG PO TABS: 10.0000 mg | ORAL_TABLET | Freq: Two times a day (BID) | ORAL | 0 refills | Status: DC | PRN

## 2022-02-06 NOTE — MAU Provider Note (Signed)
Chief Complaint: Abdominal Pain   Event Date/Time   First Provider Initiated Contact with Patient 02/06/22 0359      SUBJECTIVE HPI: Katelyn White is a 21 y.o. G2P1001 at 42w3dby LMP with Mono/Di twins pregnancy and A2/B DM who presents to maternity admissions reporting pelvic pain since early pregnancy, gradually worsening until now. There is pain in her hips, her low back and low abdomen. The pain is constant, but becomes worse at night. She was diagnosed with diabetes in this pregnancy and is taking insulin to cover elevated fasting blood sugars, but reports her postprandial glucose values are normal without treatment.  [facility] [place]. [place] so needs to transfer her care.   She denies vaginal bleeding, vaginal itching/burning, urinary symptoms, h/a, dizziness, n/v, or fever/chills.     HPI  Past Medical History:  Diagnosis Date   Headache    Infection    UTI   Past Surgical History:  Procedure Laterality Date   NO PAST SURGERIES     Social History   Socioeconomic History   Marital status: Single    Spouse name: Not on file   Number of children: Not on file   Years of education: Not on file   Highest education level: Not on file  Occupational History   Not on file  Tobacco Use   Smoking status: Former    Years: 2.00    Types: Cigarettes    Quit date: 06/30/2020    Years since quitting: 1.6   Smokeless tobacco: Never  Vaping Use   Vaping Use: Former  Substance and Sexual Activity   Alcohol use: Not Currently    Comment: weekends   Drug use: No   Sexual activity: Yes    Birth control/protection: None  Other Topics Concern   Not on file  Social History Narrative   Not on file   Social Determinants of Health   Financial Resource Strain: Not on file  Food Insecurity: Not on file  Transportation Needs: Not on file   Physical Activity: Not on file  Stress: Not on file  Social Connections: Not on file  Intimate Partner Violence: Not on file   No current facility-administered medications on file prior to encounter.   Current Outpatient Medications on File Prior to Encounter  Medication Sig Dispense Refill   Blood Pressure Monitoring (BLOOD PRESSURE KIT) DEVI 1 kit by Does not apply route once a week. Check Blood Pressure regularly and record readings into the Babyscripts App.  Large Cuff.  DX O90.0 1 each 0   Prenatal Vit-Fe Fumarate-FA (PRENATAL MULTIVITAMIN) TABS tablet Take 1 tablet by mouth daily at 12 noon.     No Known Allergies  ROS:  Review of Systems  Constitutional:  Negative for chills, fatigue and fever.  Eyes:  Negative for visual disturbance.  Respiratory:  Negative for shortness of breath.   Cardiovascular:  Negative for chest pain.  Gastrointestinal:  Positive for abdominal pain. Negative for nausea and vomiting.  Genitourinary:  Positive for pelvic pain. Negative for difficulty urinating, dysuria, flank pain, vaginal bleeding, vaginal discharge and vaginal pain.  Musculoskeletal:  Positive for back pain.  Neurological:  Negative for dizziness and headaches.  Psychiatric/Behavioral: Negative.       I have reviewed patient's Past Medical Hx, Surgical Hx, Family Hx, Social Hx, medications and allergies.   Physical  Exam  Patient Vitals for the past 24 hrs:  BP Temp Temp src Pulse Resp SpO2 Height Weight  02/06/22 0147 112/74 97.9 F (36.6 C) Oral 86 16 100 % _0  (1.626 m) 93.4 kg   Constitutional: Well-developed, well-nourished female in no acute distress.  Cardiovascular: normal rate Respiratory: normal effort GI: Abd soft, non-tender. Pos BS x 4 MS: Extremities nontender, no edema, normal ROM Neurologic: Alert and oriented x 4.  GU: Neg CVAT.  Dilation: Closed Effacement (%): Thick Cervical Position: Posterior Exam by:: Fatima Blank, CNM   FHT Baby A: 162  by doppler FHT Baby B: 158 by doppler  LAB RESULTS Results for orders placed or performed during the hospital encounter of 02/06/22 (from the past 24 hour(s))  Wet prep, genital     Status: Abnormal   Collection Time: 02/06/22  4:13 AM  Result Value Ref Range   Yeast Wet Prep HPF POC NONE SEEN NONE SEEN   Trich, Wet Prep NONE SEEN NONE SEEN   Clue Cells Wet Prep HPF POC NONE SEEN NONE SEEN   WBC, Wet Prep HPF POC >=10 (A) <10   Sperm NONE SEEN     --/--/O POS (04/15 1746)  IMAGING No results found.  MAU Management/MDM: Orders Placed This Encounter  Procedures   Wet prep, genital   [place]orea MFM OB DETAIL +14 WK   [place] MFM OB DETAIL ADDL GEST +14 WK   Urinalysis, Routine w reflex microscopic   Discharge patient    Meds ordered this encounter  Medications   cyclobenzaprine (FLEXERIL) 10 MG tablet    Sig: Take 1 tablet (10 mg total) by mouth 2 (two) times daily as needed for muscle spasms.    Dispense:  20 tablet    Refill:  0    Order Specific Question:   Supervising Provider    Answer:   Aletha Halim [[id number]]    No evidence of preterm labor with closed cervix.  Pelvic pain since early pregnancy gradually worsening.  Pt needs to transfer care to [facility] office as soon as possible, MFM [place] ordered and message sent to [facility] to reestablish care.  Rest/ice/heat/warm bath/increase PO fluids/Tylenol/pregnancy support belt for pain. Rx for Flexeril for PRN use.  PTL precautions/reasons to return to MAU reviewed.   ASSESSMENT 1. Pelvic pain affecting pregnancy in second trimester, antepartum   2. Monochorionic diamniotic twin pregnancy in second trimester   3. Back pain affecting pregnancy in third trimester   4. Gestational diabetes mellitus (GDM) requiring insulin   5. [redacted] weeks gestation of pregnancy     PLAN Discharge home Allergies as of 02/06/2022   No Known Allergies      Medication List     TAKE these medications    Blood Pressure Kit Devi 1 kit by Does not  apply route once a week. Check Blood Pressure regularly and record readings into the Babyscripts App.  Large Cuff.  DX O90.0   cyclobenzaprine 10 MG tablet Commonly known as: FLEXERIL Take 1 tablet (10 mg total) by mouth 2 (two) times daily as needed for muscle spasms.   prenatal multivitamin Tabs tablet Take 1 tablet by mouth daily at 12 noon.        Follow-up Information     [place] Follow up.   Why: The office will call you with appointment. Contact information: [place] [address] [id number] [place] [facility] Follow  up.   Specialty: Obstetrics and Gynecology Why: As needed for emergencies Contact information: [address] [id number] [place] [facility] at [facility] Follow up.   Specialty: Maternal and Fetal Medicine Why: The office will call you with ultrasound appointment. Contact information: [address] [place] [address] [place] Certified Nurse-Midwife 02/06/2022  4:38 AM

## 2022-02-06 NOTE — MAU Note (Signed)
..  Katelyn White is a 21 y.o. at [redacted]w[redacted]d here in MAU reporting: Reports lower abdominal pain that is constant and sharp and radiates to her sides.  Believes she has a UTI because she is having dysuria and frequency.  +FM. Denies vaginal bleeding. Reports she is leaking fluid when she feels the pain and is standing.   Onset of complaint: a week ago Pain score: 10/10 Vitals:   02/06/22 0147  BP: 112/74  Pulse: 86  Resp: 16  Temp: 97.9 F (36.6 C)  SpO2: 100%     JAS:[id number] to doppler twins with single doppler in triage. Will notify provider.  Lab orders placed from triage: UA

## 2022-02-06 NOTE — Discharge Instructions (Addendum)
   PREGNANCY SUPPORT BELT: You are not alone, Seventy-five percent of women have some sort of abdominal or back pain at some point in their pregnancy. Your baby is growing at a fast pace, which means that your whole body is rapidly trying to adjust to the changes. As your uterus grows, your back may start feeling a bit under stress and this can result in back or abdominal pain that can go from mild, and therefore bearable, to severe pains that will not allow you to sit or lay down comfortably, When it comes to dealing with pregnancy-related pains and cramps, some pregnant women usually prefer natural remedies, which the market is filled with nowadays. For example, wearing a pregnancy support belt can help ease and lessen your discomfort and pain. WHAT ARE THE BENEFITS OF WEARING A PREGNANCY SUPPORT BELT? A pregnancy support belt provides support to the lower portion of the belly taking some of the weight of the growing uterus and distributing to the other parts of your body. It is designed make you comfortable and gives you extra support. Over the years, the pregnancy apparel market has been studying the needs and wants of pregnant women and they have come up with the most comfortable pregnancy support belts that woman could ever ask for. In fact, you will no longer have to wear a stretched-out or bulky pregnancy belt that is visible underneath your clothes and makes you feel even more uncomfortable. Nowadays, a pregnancy support belt is made of comfortable and stretchy materials that will not irritate your skin but will actually make you feel at ease and you will not even notice you are wearing it. They are easy to put on and adjust during the day and can be worn at night for additional support.  BENEFITS: Relives Back pain Relieves Abdominal Muscle and Leg Pain Stabilizes the Pelvic Ring Offers a Cushioned Abdominal Lift Pad Relieves pressure on the Sciatic Nerve Within Minutes    Locations that Carry  Maternity/Pregnancy Support Belts  You can find belts on [employer].com starting at $10-$15.  2.  [facility] carries belts for $10-$15.        Address/Phone: [address], [address], [place], [place] [address], [phone]  3.  You may be able to file your insurance with [email or web address] and have a belt mailed to you.  If you have any problems getting the belt, let your [facility] office know.  

## 2022-02-21 ENCOUNTER — Encounter: Payer: Self-pay | Admitting: Obstetrics and Gynecology

## 2022-02-21 ENCOUNTER — Ambulatory Visit (INDEPENDENT_AMBULATORY_CARE_PROVIDER_SITE_OTHER): Payer: Medicaid Other | Admitting: Obstetrics and Gynecology

## 2022-02-21 VITALS — BP 125/72 | HR 109 | Wt 209.0 lb

## 2022-02-21 DIAGNOSIS — O24312 Unspecified pre-existing diabetes mellitus in pregnancy, second trimester: Secondary | ICD-10-CM

## 2022-02-21 DIAGNOSIS — Z3A24 24 weeks gestation of pregnancy: Secondary | ICD-10-CM

## 2022-02-21 DIAGNOSIS — O30039 Twin pregnancy, monochorionic/diamniotic, unspecified trimester: Secondary | ICD-10-CM | POA: Insufficient documentation

## 2022-02-21 DIAGNOSIS — Z3480 Encounter for supervision of other normal pregnancy, unspecified trimester: Secondary | ICD-10-CM

## 2022-02-21 DIAGNOSIS — Z3482 Encounter for supervision of other normal pregnancy, second trimester: Secondary | ICD-10-CM

## 2022-02-21 DIAGNOSIS — O24319 Unspecified pre-existing diabetes mellitus in pregnancy, unspecified trimester: Secondary | ICD-10-CM | POA: Insufficient documentation

## 2022-02-21 DIAGNOSIS — O30032 Twin pregnancy, monochorionic/diamniotic, second trimester: Secondary | ICD-10-CM

## 2022-02-21 MED ORDER — INSULIN GLARGINE 100 UNIT/ML SOLOSTAR PEN: 40.0000 [IU] | PEN_INJECTOR | Freq: Every day | SUBCUTANEOUS | 11 refills | Status: DC

## 2022-02-21 MED ORDER — INSULIN LISPRO (1 UNIT DIAL) 100 UNIT/ML (KWIKPEN): 8.0000 [IU] | PEN_INJECTOR | Freq: Three times a day (TID) | SUBCUTANEOUS | 11 refills | Status: DC

## 2022-02-21 MED ORDER — PEN NEEDLES 32G X 5 MM MISC: 6 refills | Status: DC

## 2022-02-21 NOTE — Progress Notes (Signed)
INITIAL PRENATAL VISIT NOTE  Subjective:  Katelyn White is a 21 y.o. G2P1001 at 64w4dby LMP being seen today for her initial prenatal visit as a transfer patient.  [place]. The patient has mono/di twins and likely pre-existing diabetes due to an A1c of 7.5.  Pt was offered diet and oral medication, but she declined and instead has been using insulin.  She has an obstetric history significant for vaginal delivery. She has an uncomplicated medical history significant.  Patient reports no complaints.  Contractions: Irritability. Vag. Bleeding: None.  Movement: Present. Denies leaking of fluid.    Past Medical History:  Diagnosis Date   Headache    Infection    UTI    Past Surgical History:  Procedure Laterality Date   NO PAST SURGERIES      OB History  Gravida Para Term Preterm AB Living  _0 0 0 1  SAB IAB Ectopic Multiple Live Births  0 0 0 0 1    # Outcome Date GA Lbr Len/2nd Weight Sex Delivery Anes PTL Lv  2 Current           1 Term 04/09/21 349w4d 04:08 8 lb 12.4 oz (3.98 kg) M Vag-Spont EPI  LIV    Social History   Socioeconomic History   Marital status: Single    Spouse name: Not on file   Number of children: Not on file   Years of education: Not on file   Highest education level: Not on file  Occupational History   Not on file  Tobacco Use   Smoking status: Former    Years: 2.00    Types: Cigarettes    Quit date: 06/30/2020    Years since quitting: 1.6   Smokeless tobacco: Never  Vaping Use   Vaping Use: Former  Substance and Sexual Activity   Alcohol use: Not Currently    Comment: weekends   Drug use: No   Sexual activity: Yes    Birth control/protection: None  Other Topics Concern   Not on file  Social History Narrative   Not on file   Social Determinants of Health   Financial Resource Strain: Not on file  Food Insecurity: Not on file  Transportation Needs: Not on file   Physical Activity: Not on file  Stress: Not on file  Social Connections: Not on file    Family History  Problem Relation Age of Onset   Diabetes Mother    Stroke Father    Hypertension Father      Current Outpatient Medications:    insulin glargine (LANTUS) 100 UNIT/ML Solostar Pen, Inject 40 Units into the skin daily., Disp: 15 mL, Rfl: 11   insulin lispro (HUMALOG KWIKPEN) 100 UNIT/ML KwikPen, Inject 8 Units into the skin with breakfast, with lunch, and with evening meal., Disp: 15 mL, Rfl: 11   Insulin Pen Needle (PEN NEEDLES) 32G X 5 MM MISC, Use one needle with each injection and then discard, Disp: 90 each, Rfl: 6   Prenatal Vit-Fe Fumarate-FA (PRENATAL MULTIVITAMIN) TABS tablet, Take 1 tablet by mouth daily at 12 noon., Disp: , Rfl:    Blood Pressure Monitoring (BLOOD PRESSURE KIT) DEVI, 1 kit by Does not apply route once a week. Check Blood Pressure regularly and record readings into the [employer] App.  Large Cuff.  DX O90.0, Disp: 1 each, Rfl: 0   cyclobenzaprine (FLEXERIL) 10 MG tablet, Take  1 tablet (10 mg total) by mouth 2 (two) times daily as needed for muscle spasms., Disp: 20 tablet, Rfl: 0  No Known Allergies  Review of Systems: Negative except for what is mentioned in HPI.  Objective:   Vitals:   02/21/22 0942  BP: 125/72  Pulse: (!) 109  Weight: 209 lb (94.8 kg)    Fetal Status: Fetal Heart Rate (bpm): 150/155   Movement: Present     Physical Exam: BP 125/72   Pulse (!) 109   Wt 209 lb (94.8 kg)   LMP 09/02/2021 (Exact Date)   BMI 35.87 kg/m  CONSTITUTIONAL: Well-developed, well-nourished female in no acute distress.  NEUROLOGIC: Alert and oriented to person, place, and time. Normal reflexes, muscle tone coordination. No cranial nerve deficit noted. PSYCHIATRIC: Normal mood and affect. Normal behavior. Normal judgment and thought content. SKIN: Skin is warm and dry. No rash noted. Not diaphoretic. No erythema. No pallor. HENT:  Normocephalic,  atraumatic, External right and left ear normal. Oropharynx is clear and moist EYES: Conjunctivae and EOM are normal.  NECK: Normal range of motion, supple, no masses CARDIOVASCULAR: Normal heart rate noted, regular rhythm RESPIRATORY: Effort and breath sounds normal, no problems with respiration noted BREASTS: deferred ABDOMEN: Soft, nontender, nondistended, gravid. GU: deferred MUSCULOSKELETAL: Normal range of motion. EXT:  No edema and no tenderness. 2+ distal pulses.   Assessment and Plan:  Pregnancy: G2P1001 at 21w4dby   1. [redacted] weeks gestation of pregnancy   2. Pre-existing diabetes mellitus during pregnancy in second trimester Pt stated she only took insulin just before bed and no insulin with meals.  Unsure if patient has been compliant with her diet. Recalculated insulin dose off of weight and gestational age  3659units/day total  45 units lantus , prescribed 40 units to start 10 units/ meal, prescribed 6 units per meal  Pt to follow up in 1 week to check blood sugars  - insulin glargine (LANTUS) 100 UNIT/ML Solostar Pen; Inject 40 Units into the skin daily.  Dispense: 15 mL; Refill: 11 - insulin lispro (HUMALOG KWIKPEN) 100 UNIT/ML KwikPen; Inject 8 Units into the skin with breakfast, with lunch, and with evening meal.  Dispense: 15 mL; Refill: 11 - Insulin Pen Needle (PEN NEEDLES) 32G X 5 MM MISC; Use one needle with each injection and then discard  Dispense: 90 each; Refill: 6  3. Supervision of other normal pregnancy, antepartum Continue routine prenatal care  - [place] +14 WK; Future - [place] ADDL GEST +14 WK; Future  4. Monochorionic diamniotic twin gestation in second trimester Will refer to MFM for further surveillance  - [place] +14 WK; Future - [place] ADDL GEST +14 WK; Future   Preterm labor symptoms and general obstetric precautions including but not limited to vaginal bleeding, contractions, leaking of fluid and  fetal movement were reviewed in detail with the patient.  Please refer to After Visit Summary for other counseling recommendations.   [facility] in about 1 [facility] (around 02/28/2022) [facility]  LGriffin Basil8/25/2023 12:38 PM

## 2022-02-21 NOTE — Progress Notes (Signed)
Pt is in office for NOB. Pt was being seen for [facility] in [place] up until 21 weeks- records in [place].   Pt has twin pregnancy. Pt had elevated A1C with initial labs- pt states she is on insulin.

## 2022-02-21 NOTE — Patient Instructions (Signed)
Lantus( long acting) 40 units in the morning Humalog( short acting) 8 units before each meal

## 2022-02-26 ENCOUNTER — Ambulatory Visit: Payer: Medicaid Other | Attending: Obstetrics and Gynecology

## 2022-02-26 ENCOUNTER — Ambulatory Visit: Payer: Medicaid Other | Attending: Obstetrics | Admitting: Obstetrics

## 2022-02-26 ENCOUNTER — Other Ambulatory Visit: Payer: Medicaid Other

## 2022-02-26 ENCOUNTER — Ambulatory Visit: Payer: Medicaid Other | Admitting: *Deleted

## 2022-02-26 ENCOUNTER — Encounter: Payer: Self-pay | Admitting: *Deleted

## 2022-02-26 ENCOUNTER — Ambulatory Visit: Payer: Medicaid Other

## 2022-02-26 ENCOUNTER — Other Ambulatory Visit: Payer: Self-pay | Admitting: Obstetrics and Gynecology

## 2022-02-26 VITALS — BP 113/64 | HR 89

## 2022-02-26 DIAGNOSIS — O24414 Gestational diabetes mellitus in pregnancy, insulin controlled: Secondary | ICD-10-CM | POA: Diagnosis not present

## 2022-02-26 DIAGNOSIS — O365912 Maternal care for other known or suspected poor fetal growth, first trimester, fetus 2: Secondary | ICD-10-CM

## 2022-02-26 DIAGNOSIS — O365921 Maternal care for other known or suspected poor fetal growth, second trimester, fetus 1: Secondary | ICD-10-CM | POA: Diagnosis not present

## 2022-02-26 DIAGNOSIS — O24410 Gestational diabetes mellitus in pregnancy, diet controlled: Secondary | ICD-10-CM | POA: Diagnosis not present

## 2022-02-26 DIAGNOSIS — O30032 Twin pregnancy, monochorionic/diamniotic, second trimester: Secondary | ICD-10-CM

## 2022-02-26 DIAGNOSIS — Z3A24 24 weeks gestation of pregnancy: Secondary | ICD-10-CM

## 2022-02-26 DIAGNOSIS — O365922 Maternal care for other known or suspected poor fetal growth, second trimester, fetus 2: Secondary | ICD-10-CM

## 2022-02-26 DIAGNOSIS — Z3480 Encounter for supervision of other normal pregnancy, unspecified trimester: Secondary | ICD-10-CM | POA: Diagnosis not present

## 2022-02-26 DIAGNOSIS — Z3A25 25 weeks gestation of pregnancy: Secondary | ICD-10-CM | POA: Diagnosis not present

## 2022-02-26 DIAGNOSIS — Z3689 Encounter for other specified antenatal screening: Secondary | ICD-10-CM

## 2022-02-27 ENCOUNTER — Other Ambulatory Visit: Payer: Self-pay | Admitting: *Deleted

## 2022-02-27 ENCOUNTER — Encounter: Payer: Medicaid Other | Admitting: Obstetrics and Gynecology

## 2022-02-27 DIAGNOSIS — O30032 Twin pregnancy, monochorionic/diamniotic, second trimester: Secondary | ICD-10-CM

## 2022-02-27 NOTE — Progress Notes (Signed)
MFM Note  Katelyn White was seen due to a spontaneously conceived monochorionic, diamniotic twin pregnancy.  She recently transferred her care from a hospital in [place].    She was diagnosed with insulin controlled gestational diabetes earlier in her pregnancy.  She denies any history of diabetes prior to pregnancy.  She had a cell free DNA test which indicated a low risk for trisomy 21, 18, and 13.  Two female fetus are predicted.  These are predicted to be monozygotic twins.  A thin dividing membrane was noted separating the two fetuses along with a single placenta, indicating that these are monochorionic, diamniotic twins.  Twin A: Appropriate fetal growth with an EFW measuring at the 25th percentile.  Normal amniotic fluid.  Twin B: IUGR with an EFW measuring at the 9th percentile.  Normal amniotic fluid.  There were no obvious fetal anomalies noted in either fetus today.  The views of the fetal anatomy were limited today due to the fetal position and her advanced gestational age.  Doppler studies of the umbilical arteries performed for both twin A and twin B showed continued normal forward flow.  There were no signs of absent or reversed flow noted in either fetus.  The limitations of ultrasound in the detection of all anomalies was discussed with the patient today.  The following were discussed during today's consultation:  Monochorionic, diamniotic twin gestation  The implications and management of monochorionic twins was discussed.   The 10% to 15% risk of twin to twin transfusion syndrome seen in monochorionic, diamniotic twins was discussed today.  The implications and management of twin to twin transfusion syndrome (TTTS) should she develop this complication was also discussed.    She was reassured that as there were no signs of TTTS noted today and she is already close to 25 weeks, her chances of developing TTTS later in this pregnancy are probably low.  We will  continue to follow her with frequent ultrasounds to assess for TTTS.     Delivery for uncomplicated monochorionic twins is recommended at around 37 weeks.  The increased risk of preeclampsia and preterm birth/labor associated with twin pregnancies was discussed.    As pregnancies with multiple gestations are at increased risk for developing preeclampsia, she was advised to start taking a daily baby aspirin (81 mg per day) to decrease her risk of developing preeclampsia.  Due to the increased risk of congenital heart defects associated with monochorionic twin gestations, she was referred to [facility] pediatric cardiology for a fetal echocardiogram.  Insulin controlled gestational diabetes versus pregestational diabetes  The patient was advised that it is uncertain if she has early onset gestational diabetes versus pregestational diabetes.  She reports that she was prescribed insulin 40 units at bedtime and 10 units prior to each meal.  She has not been using insulin (because she thought the dosage was too high) and has not been monitoring her fingerstick values.  She was advised that based on her weight around 90 kg, this insulin dosage is probably appropriate or even too low.  The implications and management of diabetes in pregnancy was discussed in detail with the patient.    She was encouraged to start monitoring her fingersticks 4 times daily (fasting and 2 hours after each meal).    She was advised that our goals for her fingerstick values are fasting values of 90-95 or less and two-hour postprandial values of 120 or less.    Should the majority of her fingerstick results be  above these values, her insulin dosage may have to be increased to help her achieve better glycemic control.   She was advised to record her fingerstick values on a log and bring the log with her during each visit, so that her insulin dosage may be adjusted based on her fingerstick values.  The patient was advised that  getting her fingerstick values as close to these goals as possible would provide her with the most optimal obstetrical outcome.  She understands that there may be an increased risk of adverse maternal and fetal outcomes associated with uncontrolled diabetes in pregnancy.  IUGR in pregnancy  IUGR of twin B was noted on today's exam.  Due to IUGR, another umbilical artery Doppler study was scheduled in 2 weeks.    We will reassess the fetal growth again in 3 weeks.  We will start weekly NSTs at 28 weeks.  She understands that should IUGR continue to be noted later in her pregnancy, delivery will be recommended at between 36 to 37 weeks.  She was encouraged to continue to come for her ultrasound exams in order to avoid an adverse pregnancy outcome.  The patient and her partner stated that all of their questions have been answered to their satisfaction.    Another exam was scheduled in 2 weeks.  A total of 45 minutes was spent counseling and coordinating the care for this patient.  Greater than 50% of the time was spent in direct face-to-face contact.

## 2022-03-10 ENCOUNTER — Inpatient Hospital Stay (HOSPITAL_COMMUNITY)
Admission: AD | Admit: 2022-03-10 | Discharge: 2022-03-13 | DRG: 832 | Disposition: A | Discharge status: Home / Self Care | Payer: Medicaid Other | Attending: Obstetrics & Gynecology | Admitting: Obstetrics & Gynecology

## 2022-03-10 ENCOUNTER — Ambulatory Visit: Payer: Medicaid Other

## 2022-03-10 ENCOUNTER — Ambulatory Visit: Payer: Medicaid Other | Admitting: *Deleted

## 2022-03-10 ENCOUNTER — Other Ambulatory Visit: Payer: Self-pay

## 2022-03-10 ENCOUNTER — Other Ambulatory Visit: Payer: Self-pay | Admitting: Obstetrics

## 2022-03-10 ENCOUNTER — Ambulatory Visit (HOSPITAL_BASED_OUTPATIENT_CLINIC_OR_DEPARTMENT_OTHER): Payer: Medicaid Other | Admitting: Obstetrics and Gynecology

## 2022-03-10 ENCOUNTER — Other Ambulatory Visit: Payer: Medicaid Other

## 2022-03-10 ENCOUNTER — Ambulatory Visit (HOSPITAL_BASED_OUTPATIENT_CLINIC_OR_DEPARTMENT_OTHER): Payer: Medicaid Other | Admitting: *Deleted

## 2022-03-10 ENCOUNTER — Ambulatory Visit (HOSPITAL_BASED_OUTPATIENT_CLINIC_OR_DEPARTMENT_OTHER): Payer: Medicaid Other

## 2022-03-10 ENCOUNTER — Encounter (HOSPITAL_COMMUNITY): Payer: Self-pay | Admitting: Obstetrics & Gynecology

## 2022-03-10 ENCOUNTER — Encounter: Payer: Self-pay | Admitting: *Deleted

## 2022-03-10 VITALS — BP 125/69 | HR 108

## 2022-03-10 DIAGNOSIS — O30039 Twin pregnancy, monochorionic/diamniotic, unspecified trimester: Secondary | ICD-10-CM

## 2022-03-10 DIAGNOSIS — Z3A26 26 weeks gestation of pregnancy: Secondary | ICD-10-CM | POA: Diagnosis not present

## 2022-03-10 DIAGNOSIS — R1030 Lower abdominal pain, unspecified: Secondary | ICD-10-CM | POA: Insufficient documentation

## 2022-03-10 DIAGNOSIS — Z3480 Encounter for supervision of other normal pregnancy, unspecified trimester: Secondary | ICD-10-CM

## 2022-03-10 DIAGNOSIS — O26892 Other specified pregnancy related conditions, second trimester: Secondary | ICD-10-CM | POA: Insufficient documentation

## 2022-03-10 DIAGNOSIS — O24414 Gestational diabetes mellitus in pregnancy, insulin controlled: Secondary | ICD-10-CM | POA: Insufficient documentation

## 2022-03-10 DIAGNOSIS — O26873 Cervical shortening, third trimester: Secondary | ICD-10-CM | POA: Diagnosis not present

## 2022-03-10 DIAGNOSIS — E119 Type 2 diabetes mellitus without complications: Secondary | ICD-10-CM | POA: Diagnosis present

## 2022-03-10 DIAGNOSIS — O365922 Maternal care for other known or suspected poor fetal growth, second trimester, fetus 2: Secondary | ICD-10-CM

## 2022-03-10 DIAGNOSIS — O321XX2 Maternal care for breech presentation, fetus 2: Secondary | ICD-10-CM | POA: Diagnosis present

## 2022-03-10 DIAGNOSIS — O30032 Twin pregnancy, monochorionic/diamniotic, second trimester: Secondary | ICD-10-CM | POA: Insufficient documentation

## 2022-03-10 DIAGNOSIS — O365921 Maternal care for other known or suspected poor fetal growth, second trimester, fetus 1: Secondary | ICD-10-CM | POA: Diagnosis present

## 2022-03-10 DIAGNOSIS — Z87891 Personal history of nicotine dependence: Secondary | ICD-10-CM

## 2022-03-10 DIAGNOSIS — Z833 Family history of diabetes mellitus: Secondary | ICD-10-CM

## 2022-03-10 DIAGNOSIS — O321XX1 Maternal care for breech presentation, fetus 1: Secondary | ICD-10-CM | POA: Diagnosis present

## 2022-03-10 DIAGNOSIS — M545 Low back pain, unspecified: Secondary | ICD-10-CM | POA: Insufficient documentation

## 2022-03-10 DIAGNOSIS — O365920 Maternal care for other known or suspected poor fetal growth, second trimester, not applicable or unspecified: Secondary | ICD-10-CM | POA: Diagnosis not present

## 2022-03-10 DIAGNOSIS — O368320 Maternal care for abnormalities of the fetal heart rate or rhythm, second trimester, not applicable or unspecified: Secondary | ICD-10-CM | POA: Insufficient documentation

## 2022-03-10 DIAGNOSIS — O24112 Pre-existing diabetes mellitus, type 2, in pregnancy, second trimester: Secondary | ICD-10-CM | POA: Diagnosis present

## 2022-03-10 DIAGNOSIS — Z794 Long term (current) use of insulin: Secondary | ICD-10-CM

## 2022-03-10 DIAGNOSIS — O3432 Maternal care for cervical incompetence, second trimester: Secondary | ICD-10-CM | POA: Diagnosis not present

## 2022-03-10 DIAGNOSIS — O24312 Unspecified pre-existing diabetes mellitus in pregnancy, second trimester: Secondary | ICD-10-CM

## 2022-03-10 DIAGNOSIS — O26872 Cervical shortening, second trimester: Secondary | ICD-10-CM | POA: Diagnosis present

## 2022-03-10 DIAGNOSIS — Z3A27 27 weeks gestation of pregnancy: Secondary | ICD-10-CM | POA: Insufficient documentation

## 2022-03-10 DIAGNOSIS — O365990 Maternal care for other known or suspected poor fetal growth, unspecified trimester, not applicable or unspecified: Secondary | ICD-10-CM | POA: Diagnosis present

## 2022-03-10 DIAGNOSIS — O24419 Gestational diabetes mellitus in pregnancy, unspecified control: Secondary | ICD-10-CM

## 2022-03-10 DIAGNOSIS — O24319 Unspecified pre-existing diabetes mellitus in pregnancy, unspecified trimester: Secondary | ICD-10-CM | POA: Diagnosis present

## 2022-03-10 LAB — COMPREHENSIVE METABOLIC PANEL
ALT: 12 U/L (ref 0–44)
AST: 17 U/L (ref 15–41)
Albumin: 2.5 g/dL — ABNORMAL LOW (ref 3.5–5.0)
Alkaline Phosphatase: 108 U/L (ref 38–126)
Anion gap: 12 (ref 5–15)
BUN: <5 mg/dL — ABNORMAL LOW (ref 6–20)
CO2: 20 mmol/L — ABNORMAL LOW (ref 22–32)
Calcium: 8.9 mg/dL (ref 8.9–10.3)
Chloride: 105 mmol/L (ref 98–111)
Creatinine, Ser: 0.51 mg/dL (ref 0.44–1.00)
GFR, Estimated: >60 mL/min (ref >60)
Glucose, Bld: 99 mg/dL (ref 70–99)
Potassium: 3.8 mmol/L (ref 3.5–5.1)
Sodium: 137 mmol/L (ref 135–145)
Total Bilirubin: 0.3 mg/dL (ref 0.3–1.2)
Total Protein: 5.8 g/dL — ABNORMAL LOW (ref 6.5–8.1)

## 2022-03-10 LAB — URINALYSIS, ROUTINE W REFLEX MICROSCOPIC
Bilirubin Urine: NEGATIVE
Glucose, UA: 50 mg/dL — AB
Hgb urine dipstick: NEGATIVE
Ketones, ur: 20 mg/dL — AB
Nitrite: NEGATIVE
Protein, ur: NEGATIVE mg/dL
Specific Gravity, Urine: 1.015 (ref 1.005–1.030)
pH: 7 (ref 5.0–8.0)

## 2022-03-10 LAB — GLUCOSE, CAPILLARY
Glucose-Capillary: 102 mg/dL — ABNORMAL HIGH (ref 70–99)
Glucose-Capillary: 124 mg/dL — ABNORMAL HIGH (ref 70–99)

## 2022-03-10 LAB — CBC
HCT: 32.5 % — ABNORMAL LOW (ref 36.0–46.0)
Hemoglobin: 10.5 g/dL — ABNORMAL LOW (ref 12.0–15.0)
MCH: 26 pg (ref 26.0–34.0)
MCHC: 32.3 g/dL (ref 30.0–36.0)
MCV: 80.4 fL (ref 80.0–100.0)
Platelets: 268 10*3/uL (ref 150–400)
RBC: 4.04 MIL/uL (ref 3.87–5.11)
RDW: 14.6 % (ref 11.5–15.5)
WBC: 13.2 10*3/uL — ABNORMAL HIGH (ref 4.0–10.5)
nRBC: 0 % (ref 0.0–0.2)

## 2022-03-10 LAB — TYPE AND SCREEN
ABO/RH(D): O POS
Antibody Screen: NEGATIVE

## 2022-03-10 MED ORDER — INSULIN GLARGINE-YFGN 100 UNIT/ML ~~LOC~~ SOLN
44.0000 [IU] | Freq: Every day | SUBCUTANEOUS | Status: DC
  Administered 2022-03-10: 44 [IU] via SUBCUTANEOUS
  Filled 2022-03-10 (×2): qty 0.44

## 2022-03-10 MED ORDER — ACETAMINOPHEN 325 MG PO TABS: 650.0000 mg | ORAL_TABLET | ORAL | Status: DC | PRN

## 2022-03-10 MED ORDER — ENOXAPARIN SODIUM 40 MG/0.4ML IJ SOSY
40.0000 mg | PREFILLED_SYRINGE | INTRAMUSCULAR | Status: DC
  Administered 2022-03-10 – 2022-03-11 (×2): 40 mg via SUBCUTANEOUS
  Filled 2022-03-10 (×3): qty 0.4

## 2022-03-10 MED ORDER — PRENATAL MULTIVITAMIN CH
1.0000 | ORAL_TABLET | Freq: Every day | ORAL | Status: DC
  Administered 2022-03-10 – 2022-03-13 (×4): 1 via ORAL
  Filled 2022-03-10 (×4): qty 1

## 2022-03-10 MED ORDER — DOCUSATE SODIUM 100 MG PO CAPS
100.0000 mg | ORAL_CAPSULE | Freq: Every day | ORAL | Status: DC
  Administered 2022-03-10 – 2022-03-13 (×3): 100 mg via ORAL
  Filled 2022-03-10 (×3): qty 1

## 2022-03-10 MED ORDER — CALCIUM CARBONATE ANTACID 500 MG PO CHEW: 2.0000 | CHEWABLE_TABLET | ORAL | Status: DC | PRN

## 2022-03-10 MED ORDER — ZOLPIDEM TARTRATE 5 MG PO TABS: 5.0000 mg | ORAL_TABLET | Freq: Every evening | ORAL | Status: DC | PRN

## 2022-03-10 MED ORDER — BETAMETHASONE SOD PHOS & ACET 6 (3-3) MG/ML IJ SUSP
12.0000 mg | INTRAMUSCULAR | Status: AC
  Administered 2022-03-10 – 2022-03-11 (×2): 12 mg via INTRAMUSCULAR
  Filled 2022-03-10: qty 5

## 2022-03-10 MED ORDER — NIFEDIPINE ER OSMOTIC RELEASE 30 MG PO TB24
30.0000 mg | ORAL_TABLET | Freq: Every day | ORAL | Status: DC
  Administered 2022-03-10 – 2022-03-13 (×4): 30 mg via ORAL
  Filled 2022-03-10 (×4): qty 1

## 2022-03-10 MED ORDER — INSULIN ASPART 100 UNIT/ML IJ SOLN
8.0000 [IU] | Freq: Three times a day (TID) | INTRAMUSCULAR | Status: DC
  Administered 2022-03-10 (×2): 8 [IU] via SUBCUTANEOUS

## 2022-03-10 NOTE — H&P (Signed)
Katelyn White is a 21 y.o. female G2P1001 Estimated Date of Delivery: 06/09/22 with mono/di twin pregnancy Class A2 DM with cervical shortenining, reported uterine activity, FGR twin B, breech with cord above the cervix presenting for steroid administration and diabetes control.   OB History     Gravida  2   Para  1   Term  1   Preterm  0   AB  0   Living  1      SAB  0   IAB  0   Ectopic  0   Multiple  0   Live Births  1          Past Medical History:  Diagnosis Date   Headache    Infection    UTI   Past Surgical History:  Procedure Laterality Date   NO PAST SURGERIES     Family History: family history includes Diabetes in her mother; Hypertension in her father; Stroke in her father. Social History:  reports that she quit smoking about 20 months ago. Her smoking use included cigarettes. She has never used smokeless tobacco. She reports that she does not currently use alcohol. She reports that she does not use drugs.     Maternal Diabetes: Yes:  Diabetes Type:  Insulin/Medication controlled Genetic Screening: Normal Maternal Ultrasounds/Referrals: Other: Fetal Ultrasounds or other Referrals:  Other: Mono di twins, FGR twin B(9%) Maternal Substance Abuse:  No Significant Maternal Medications:  Meds include: Other: insulin Significant Maternal Lab Results:  None Number of Prenatal Visits:greater than 3 verified prenatal visits Other Comments:  None  Review of Systems  Review of Systems  Constitutional: Negative for fever, chills, weight loss, malaise/fatigue and diaphoresis.  HENT: Negative for hearing loss, ear pain, nosebleeds, congestion, sore throat, neck pain, tinnitus and ear discharge.   Eyes: Negative for blurred vision, double vision, photophobia, pain, discharge and redness.  Respiratory: Negative for cough, hemoptysis, sputum production, shortness of breath, wheezing and stridor.   Cardiovascular: Negative for chest pain, palpitations,  orthopnea, claudication, leg swelling and PND.  Gastrointestinal: negative for abdominal pain. Negative for heartburn, nausea, vomiting, diarrhea, constipation, blood in stool and melena.  Genitourinary: Negative for dysuria, urgency, frequency, hematuria and flank pain.  Musculoskeletal: Negative for myalgias, back pain, joint pain and falls.  Skin: Negative for itching and rash.  Neurological: Negative for dizziness, tingling, tremors, sensory change, speech change, focal weakness, seizures, loss of consciousness, weakness and headaches.  Endo/Heme/Allergies: Negative for environmental allergies and polydipsia. Does not bruise/bleed easily.  Psychiatric/Behavioral: Negative for depression, suicidal ideas, hallucinations, memory loss and substance abuse. The patient is not nervous/anxious and does not have insomnia.       History Past Medical History:  Diagnosis Date   Headache    Infection    UTI    Past Surgical History:  Procedure Laterality Date   NO PAST SURGERIES      OB History     Gravida  2   Para  1   Term  1   Preterm  0   AB  0   Living  1      SAB  0   IAB  0   Ectopic  0   Multiple  0   Live Births  1           No Known Allergies  Social History   Socioeconomic History   Marital status: Single    Spouse name: Not on file   Number of children:  Not on file   Years of education: Not on file   Highest education level: Not on file  Occupational History   Not on file  Tobacco Use   Smoking status: Former    Years: 2.00    Types: Cigarettes    Quit date: 06/30/2020    Years since quitting: 1.6   Smokeless tobacco: Never  Vaping Use   Vaping Use: Former  Substance and Sexual Activity   Alcohol use: Not Currently    Comment: weekends   Drug use: No   Sexual activity: Yes    Birth control/protection: None  Other Topics Concern   Not on file  Social History Narrative   Not on file   Social Determinants of Health   Financial  Resource Strain: Not on file  Food Insecurity: Not on file  Transportation Needs: Not on file  Physical Activity: Not on file  Stress: Not on file  Social Connections: Not on file    Family History  Problem Relation Age of Onset   Diabetes Mother    Stroke Father    Hypertension Father       Blood pressure 116/63, pulse 97, temperature 98.2 F (36.8 C), temperature source Oral, resp. rate 16, height 5\' 4"  (1.626 m), weight 95 kg, last menstrual period 09/02/2021, SpO2 97 %, not currently breastfeeding. Exam Physical Exam  Physical Exam  Vitals reviewed. Constitutional: She is oriented to person, place, and time. She appears well-developed and well-nourished.  HENT:  Head: Normocephalic and atraumatic.  Right Ear: External ear normal.  Left Ear: External ear normal.  Nose: Nose normal.  Mouth/Throat: Oropharynx is clear and moist.  Eyes: Conjunctivae and EOM are normal. Pupils are equal, round, and reactive to light. Right eye exhibits no discharge. Left eye exhibits no discharge. No scleral icterus.  Neck: Normal range of motion. Neck supple. No tracheal deviation present. No thyromegaly present.  Cardiovascular: Normal rate, regular rhythm, normal heart sounds and intact distal pulses.  Exam reveals no gallop and no friction rub.   No murmur heard. Respiratory: Effort normal and breath sounds normal. No respiratory distress. She has no wheezes. She has no rales. She exhibits no tenderness.  GI: Soft. Bowel sounds are normal. She exhibits no distension and no mass. There is tenderness. There is no rebound and no guarding.  Genitourinary:       Vulva is normal without lesions Vagina is pink moist without discharge Cervix normal in appearance and pap is normal Uterus is size consistent with 32 weeks Adnexa is negative with normal sized ovaries by sonogram  Musculoskeletal: Normal range of motion. She exhibits no edema and no tenderness.  Neurological: She is alert and oriented  to person, place, and time. She has normal reflexes. She displays normal reflexes. No cranial nerve deficit. She exhibits normal muscle tone. Coordination normal.  Skin: Skin is warm and dry. No rash noted. No erythema. No pallor.  Psychiatric: She has a normal mood and affect. Her behavior is normal. Judgment and thought content normal.   Prenatal labs: ABO, Rh: --/--/O POS (04/15 1746) Antibody: NEG (10/11 0018) Rubella:   RPR: Nonreactive (08/08 0000)  HBsAg: Negative (08/08 0000)  HIV: Non-reactive (08/08 0000)  GBS: Negative/-- (09/28 0000)   Assessment/Plan: [redacted]w[redacted]d G2P1001 Estimated Date of Delivery: 06/09/22  Mono/Di twins(reviewed sonogram 5/23-->consistent with Monochorionic although no specific image is obtained to determine, amnion seen clearly, no lambda sign is seen)  Class A2 DM Cervical shortening with reported uterine activity FGR twin  B(9%)-->discordance is only 10%, both Doppler flow studies are normal Twin B cord above cervix with presentation breech  >administer BMZ and adjust insulin as needed >begin procardia xl 30 for suspected uterine activity as source of cervical shortening `  Once CBG stabilized should be able to be discharged home for outpatient monitoring  Florian Buff 03/10/2022, 2:55 PM

## 2022-03-10 NOTE — Procedures (Signed)
Katelyn White 09/28/2000 [redacted]w[redacted]d   Fetus B Non-Stress Test Interpretation for 03/10/22  Indication:  mo di twins  with gestational diabetes  Fetal Heart Rate Fetus B Mode: External Baseline Rate (B): 150 BPM Variability: Moderate Accelerations: 15 x 15 Decelerations: Variable  Uterine Activity Mode: Toco Contraction Frequency (min): occas u/c with much uterine irritability Contraction Duration (sec): 35 Contraction Quality: Moderate (pt feels low abd cramps and low back ache at the same time) Resting Tone Palpated: Relaxed Resting Time: Inadequate     Katelyn White 08-Jul-2000 [redacted]w[redacted]d  Fetus A Non-Stress Test Interpretation for 03/10/22  Indication:  mo di twins  Fetal Heart Rate A Mode: External Baseline Rate (A): 140 bpm Variability: Moderate Accelerations: 10 x 10 Decelerations: None Multiple birth?: Yes  Uterine Activity Mode: Toco Contraction Frequency (min): occas u/c with much uterine irritability Contraction Duration (sec): 35 Contraction Quality: Moderate (pt feels low abd cramps and low back ache at the same time) Resting Tone Palpated: Relaxed Resting Time: Inadequate  Interpretation (Fetal Testing) Nonstress Test Interpretation: Reactive Overall Impression: Reassuring for gestational age Comments: tracing reviewed by Dr. Judeth Cornfield

## 2022-03-10 NOTE — MAU Note (Signed)
Pt for direct adm per MD note in MFM. Discussed with [facility] charge nurse.  Druscilla Brownie, CNM spoke with Dr Despina Hidden and confirmed.  Pt will go to rm 104.  They will come get her.

## 2022-03-10 NOTE — Progress Notes (Signed)
Maternal-Fetal Medicine (follow-up consultation)  Name: Katelyn White DOB: [birth year] MRN: [id number] Referring Provider: Mariel Aloe, MD  I had the pleasure of [facility] [name] [facility]. She is G2 P1001 at 27-weeks' gestation and is here for umbilical artery Doppler study. Her high risk problems include: -Monochorionic-diamniotic twin pregnancy with selective fetal growth restriction of twin B.  On ultrasound performed 2 weeks ago, the estimated fetal weight of twin B was at the 9th percentile. -Pregestational diabetes.  Patient takes insulin for control.  Patient has been irregular in taking insulin. On cell-free fetal DNA screening, the risks of fetal aneuploidies are not increased (according to the patient).  She recently moved to [place] from [place]. She had MFM consultation at her previous visit.  Patient reports intermittent uterine contractions but no history of vaginal bleeding or leakage of amniotic fluid.  Ultrasound Monochorionic-diamniotic twin pregnancy. Twin A: Maternal right, lower fetus, breech presentation, anterior placenta.  Amniotic fluid is normal and good fetal activity seen.  Fetal bladder is seen well.  Umbilical artery Doppler showed normal forward diastolic flow.  NST is reactive for this gestational age 41 B: Upper fetus, maternal left, breech presentation, anterior placenta.  Amniotic fluid is normal and good fetal activity seen.  Fetal bladder is seen well and appears normal.  Umbilical artery Doppler showed normal forward diastolic flow.  NST is reactive for this gestational age.    No uterine contractions were observed on NST monitoring. NST was performed after ultrasound.  On transabdominal scan, cervix appears short with funneling.  After explaining, we performed a transvaginal ultrasound to evaluate the cervix.  The shortest cervical length measurement is 1 cm.  Umbilical cord was seen floating between fetal  presenting part and the internal os.  Patient felt uncomfortable during transvaginal ultrasound.  I did not, therefore, performed sterile speculum examination.  I counseled the couple on the following: Cervical shortening Cervical shortening and twin pregnancy are associated with increased risk of preterm delivery.  I explained the significance of transvaginal ultrasound findings.  Patient reports irregular contractions at home and this is likely to increase the risk of preterm premature rupture membranes/preterm delivery.  Umbilical cord overlying the cervix is of special concern because of the risk of cord prolapse with breech presentation if she has rupture of membranes.   I discussed the benefit of antenatal corticosteroids.  Because of inadequate treatment, diabetes is likely to be poorly controlled.  I recommended that she be evaluated as an inpatient to monitor her blood glucose while on steroids.  Patient agreed with my recommendations. Discussed with Dr. Despina Hidden.  Patient to be admitted in [facility] specialty.  Recommendations -Inpatient management with a course of antenatal corticosteroids. -Diabetes control. -When discharge is considered, I recommend a limited ultrasound evaluation to assess the lower uterine segment for the presence of umbilical cord. -Fetal growth assessment next week.  Thank you for consultation.  If you have any questions or concerns, please contact me the [facility].  Consultation including face-to-face (more than 50%) counseling 30 minutes.

## 2022-03-11 DIAGNOSIS — O365990 Maternal care for other known or suspected poor fetal growth, unspecified trimester, not applicable or unspecified: Secondary | ICD-10-CM | POA: Diagnosis present

## 2022-03-11 DIAGNOSIS — O26872 Cervical shortening, second trimester: Secondary | ICD-10-CM

## 2022-03-11 LAB — GLUCOSE, CAPILLARY
Glucose-Capillary: 120 mg/dL — ABNORMAL HIGH (ref 70–99)
Glucose-Capillary: 129 mg/dL — ABNORMAL HIGH (ref 70–99)
Glucose-Capillary: 145 mg/dL — ABNORMAL HIGH (ref 70–99)

## 2022-03-11 MED ORDER — LACTATED RINGERS IV BOLUS
500.0000 mL | Freq: Once | INTRAVENOUS | Status: AC
  Administered 2022-03-11: 500 mL via INTRAVENOUS

## 2022-03-11 MED ORDER — CYCLOBENZAPRINE HCL 10 MG PO TABS: 10.0000 mg | ORAL_TABLET | Freq: Three times a day (TID) | ORAL | Status: DC | PRN

## 2022-03-11 MED ORDER — CYCLOBENZAPRINE HCL 10 MG PO TABS
5.0000 mg | ORAL_TABLET | Freq: Once | ORAL | Status: AC
  Administered 2022-03-11: 5 mg via ORAL
  Filled 2022-03-11: qty 1

## 2022-03-11 MED ORDER — INSULIN GLARGINE-YFGN 100 UNIT/ML ~~LOC~~ SOLN
50.0000 [IU] | Freq: Every day | SUBCUTANEOUS | Status: DC
  Administered 2022-03-11: 50 [IU] via SUBCUTANEOUS
  Filled 2022-03-11 (×2): qty 0.5

## 2022-03-11 MED ORDER — SODIUM CHLORIDE 0.9% FLUSH: 3.0000 mL | Freq: Two times a day (BID) | INTRAVENOUS | Status: DC

## 2022-03-11 MED ORDER — INSULIN ASPART 100 UNIT/ML IJ SOLN
12.0000 [IU] | Freq: Three times a day (TID) | INTRAMUSCULAR | Status: DC
  Administered 2022-03-11 – 2022-03-13 (×6): 12 [IU] via SUBCUTANEOUS

## 2022-03-11 NOTE — Progress Notes (Signed)
Patient ID: Katelyn White, female   DOB: February 13, 2001, 21 y.o.   MRN: [id number] FACULTY PRACTICE ANTEPARTUM(COMPREHENSIVE) NOTE  Katelyn White is a 21 y.o. G2P1001 at [redacted]w[redacted]d by best clinical estimate who is admitted for Preterm labor.   Fetal presentation is breech and breech . Length of Stay:  1  Days  ASSESSMENT: Principal Problem:   Short cervical length during pregnancy in second trimester Active Problems:   Pre-existing diabetes mellitus in pregnancy   Monochorionic diamniotic twin gestation in second trimester   Maternal care for restricted fetal growth, antepartum   PLAN: Short cervical length during pregnancy in second trimester - s/p BMZ x 1, second today On Procardia - some increased pain last night, responded to fluids and Flexeril  Pre-existing diabetes mellitus in pregnancy - on insulin, CBGs are stable, will see how next BMZ affects  Monochorionic diamniotic twin gestation in second trimester - breech/breech presentation with short cx, minimal discordance, twin B is < 10% reassuring FHR x 2  ? Funic presentation - noted on u/s - for f/u prior to dc.  Subjective: Feeling better this morning and slept well overnight Patient reports the fetal movement as active. Patient reports uterine contraction  activity as irregular, every 5-10 minutes. Patient reports  vaginal bleeding as none. Patient describes fluid per vagina as None.  Vitals:  Blood pressure 125/61, pulse (!) 122, temperature 98 F (36.7 C), temperature source Oral, resp. rate 17, height 5\' 4"  (1.626 m), weight 95 kg, last menstrual period 09/02/2021, SpO2 98 %, not currently breastfeeding. Physical Examination:  General appearance - alert, well appearing, and in no distress Chest - normal effort Abdomen - gravid, non-tender Fundal Height:  size greater than dates Extremities: extremities normal, atraumatic, no cyanosis or edema  Membranes:intact  Fetal Monitoring: Baby A Baseline: 155 bpm,  Variability: Good {> 6 bpm), Accelerations: Reactive, and Decelerations: Absent Baby B Baseline: 150 bpm, Variability: Good {> 6 bpm), Accelerations: Reactive, and Decelerations: Absent Labs:  Results for orders placed or performed during the hospital encounter of 03/10/22 (from the past 24 hour(s))  Comprehensive metabolic panel   Collection Time: 03/10/22  3:46 PM  Result Value Ref Range   Sodium 137 135 - 145 mmol/L   Potassium 3.8 3.5 - 5.1 mmol/L   Chloride 105 98 - 111 mmol/L   CO2 20 (L) 22 - 32 mmol/L   Glucose, Bld 99 70 - 99 mg/dL   BUN <5 (L) 6 - 20 mg/dL   Creatinine, Ser 05/10/22 0.44 - 1.00 mg/dL   Calcium 8.9 8.9 - 8.29 mg/dL   Total Protein 5.8 (L) 6.5 - 8.1 g/dL   Albumin 2.5 (L) 3.5 - 5.0 g/dL   AST 17 15 - 41 U/L   ALT 12 0 - 44 U/L   Alkaline Phosphatase 108 38 - 126 U/L   Total Bilirubin 0.3 0.3 - 1.2 mg/dL   GFR, Estimated 93.7 >16 mL/min   Anion gap 12 5 - 15  CBC   Collection Time: 03/10/22  3:46 PM  Result Value Ref Range   WBC 13.2 (H) 4.0 - 10.5 K/uL   RBC 4.04 3.87 - 5.11 MIL/uL   Hemoglobin 10.5 (L) 12.0 - 15.0 g/dL   HCT 05/10/22 (L) 78.9 - 38.1 %   MCV 80.4 80.0 - 100.0 fL   MCH 26.0 26.0 - 34.0 pg   MCHC 32.3 30.0 - 36.0 g/dL   RDW 01.7 51.0 - 25.8 %   Platelets 268 150 - 400 K/uL  nRBC 0.0 0.0 - 0.2 %  Type and screen [facility]   Collection Time: 03/10/22  3:46 PM  Result Value Ref Range   ABO/RH(D) O POS    Antibody Screen NEG    Sample Expiration      03/13/2022,2359 Performed at [facility], [address] [place], [place] [address]   Glucose, capillary   Collection Time: 03/10/22  6:26 PM  Result Value Ref Range   Glucose-Capillary 102 (H) 70 - 99 mg/dL  Glucose, capillary   Collection Time: 03/10/22 10:48 PM  Result Value Ref Range   Glucose-Capillary 124 (H) 70 - 99 mg/dL  Urinalysis, Routine w reflex microscopic Urine, Clean Catch   Collection Time: 03/10/22 10:53 PM  Result Value Ref Range   Color,  Urine YELLOW YELLOW   APPearance HAZY (A) CLEAR   Specific Gravity, Urine 1.015 1.005 - 1.030   pH 7.0 5.0 - 8.0   Glucose, UA 50 (A) NEGATIVE mg/dL   Hgb urine dipstick NEGATIVE NEGATIVE   Bilirubin Urine NEGATIVE NEGATIVE   Ketones, ur 20 (A) NEGATIVE mg/dL   Protein, ur NEGATIVE NEGATIVE mg/dL   Nitrite NEGATIVE NEGATIVE   Leukocytes,Ua MODERATE (A) NEGATIVE   RBC / HPF 0-5 0 - 5 RBC/hpf   WBC, UA 0-5 0 - 5 WBC/hpf   Bacteria, UA RARE (A) NONE SEEN   Squamous Epithelial / LPF 6-10 0 - 5   Mucus PRESENT     Imaging Studies:      Medications:  Scheduled  betamethasone acetate-betamethasone sodium phosphate  12 mg Intramuscular Q24H   docusate sodium  100 mg Oral Daily   enoxaparin (LOVENOX) injection  40 mg Subcutaneous Q24H   insulin aspart  8 Units Subcutaneous TID WC   insulin glargine-yfgn  44 Units Subcutaneous QHS   NIFEdipine  30 mg Oral Daily   prenatal multivitamin  1 tablet Oral Q1200   I have reviewed the patient's current medications.   Reva Bores, MD 03/11/2022,7:30 AM

## 2022-03-12 ENCOUNTER — Other Ambulatory Visit: Payer: Medicaid Other

## 2022-03-12 ENCOUNTER — Ambulatory Visit: Payer: Medicaid Other

## 2022-03-12 LAB — GLUCOSE, CAPILLARY
Glucose-Capillary: 122 mg/dL — ABNORMAL HIGH (ref 70–99)
Glucose-Capillary: 90 mg/dL (ref 70–99)
Glucose-Capillary: 121 mg/dL — ABNORMAL HIGH (ref 70–99)
Glucose-Capillary: 116 mg/dL — ABNORMAL HIGH (ref 70–99)

## 2022-03-12 MED ORDER — INSULIN GLARGINE-YFGN 100 UNIT/ML ~~LOC~~ SOLN
54.0000 [IU] | Freq: Every day | SUBCUTANEOUS | Status: DC
  Administered 2022-03-12: 54 [IU] via SUBCUTANEOUS
  Filled 2022-03-12 (×2): qty 0.54

## 2022-03-12 NOTE — Progress Notes (Signed)
Patient ID: Katelyn White, female   DOB: 2000-07-11, 21 y.o.   MRN: [id number] FACULTY PRACTICE ANTEPARTUM(COMPREHENSIVE) NOTE  Katelyn White is a 21 y.o. G2P1001 with Estimated Date of Delivery: 06/09/22   By  early ultrasound 103w2d  who is admitted for Preterm labor.    Fetal presentation is breech. Length of Stay:  2  Days  Date of admission:03/10/2022  Subjective: No uterine activity Patient reports the fetal movement as active. Patient reports uterine contraction  activity as none. Patient reports  vaginal bleeding as none. Patient describes fluid per vagina as None.  Vitals:  Blood pressure (!) 95/41, pulse 85, temperature 98.1 F (36.7 C), temperature source Oral, resp. rate 17, height 5\' 4"  (1.626 m), weight 95 kg, last menstrual period 09/02/2021, SpO2 100 %, not currently breastfeeding. Vitals:   03/11/22 1619 03/11/22 2004 03/12/22 0447 03/12/22 0806  BP: (!) 105/44 (!) 104/50 (!) 97/40 (!) 95/41  Pulse: (!) 101 (!) 109 84 85  Resp: 18 17 18 17   Temp: 98 F (36.7 C) 98.6 F (37 C) 97.9 F (36.6 C) 98.1 F (36.7 C)  TempSrc: Oral Oral Oral Oral  SpO2: 98% 97% 97% 100%  Weight:      Height:       Physical Examination:  General appearance - alert, well appearing, and in no distress Abdomen - soft, nontender, nondistended, no masses or organomegaly Fundal Height:  size greater than dates Pelvic Exam:  examination not indicated Cervical Exam: Not evaluated. . Extremities: extremities normal, atraumatic, no cyanosis or edema with DTRs 2+ bilaterally Membranes:intact  Fetal Monitoring:  Baseline: 150s bpm, Variability: Fair (1-6 bpm), Accelerations: Non-reactive but appropriate for gestational age, and Decelerations: Absent   appropriate  Labs:  Results for orders placed or performed during the hospital encounter of 03/10/22 (from the past 24 hour(s))  Glucose, capillary   Collection Time: 03/11/22  3:33 PM  Result Value Ref Range   Glucose-Capillary 129 (H) 70  - 99 mg/dL  Glucose, capillary   Collection Time: 03/11/22  7:07 PM  Result Value Ref Range   Glucose-Capillary 145 (H) 70 - 99 mg/dL  Glucose, capillary   Collection Time: 03/12/22  4:46 AM  Result Value Ref Range   Glucose-Capillary 122 (H) 70 - 99 mg/dL    Imaging Studies:    [place] MFM UA CORD DOPPLER  Result Date: 03/10/2022 ----------------------------------------------------------------------  OBSTETRICS REPORT                       (Signed Final 03/10/2022 01:30 pm) ---------------------------------------------------------------------- Patient Info  ID #:       [id number]                          D.O.B.:  2001-03-03 (21 yrs)  Name:       Katelyn White                Visit Date: 03/10/2022 10:48 am ---------------------------------------------------------------------- Performed By  Attending:        Noralee Space MD        Ref. Address:     [address]  [address]                                                             [address]                                                             [place]                                                             [address]  Performed By:     Burt Knack RDMS     [facility]  Referred By:      [facility] ---------------------------------------------------------------------- Orders  #  Description                           Code        Ordered By  1  [place] MFM UA CORD DOPPLER                76820.02    YU FANG  2  [place] MFM OB LIMITED                     76815.01    YU FANG  3  [place] MFM UA ADDL GEST                   76820.01    YU FANG  4  [place] MFM OB TRANSVAGINAL                60454.0     YU FANG ----------------------------------------------------------------------  #   Order #  Accession #                Episode #  1  [id number]                   [id number]                 [id number]  2  [id number]                   [id number]                 [id number]  3  [id number]                   [id number]                 [id number]  4  [id number]                   [id number]                 [id number] ---------------------------------------------------------------------- Indications  Twin pregnancy, mono/di, second trimester      O30.032  [redacted] weeks gestation of pregnancy                Z3A.26  Maternal care for known or suspected poor      O36.5921  fetal growth, second trimester, fetus 1 IUGR  Gestational diabetes in pregnancy, insulin     O24.414  controlled ---------------------------------------------------------------------- Fetal Evaluation (Fetus A)  Num Of Fetuses:         2  Fetal Heart Rate(bpm):  149  Cardiac Activity:       Observed  Fetal Lie:              Maternal right side lower  Presentation:           Breech  Placenta:               Anterior  Amniotic Fluid  AFI FV:      Within normal limits                              Largest Pocket(cm)                              3.71 ---------------------------------------------------------------------- OB History  Gravidity:    2         Term:   1  Living:       1 ---------------------------------------------------------------------- Gestational Age (Fetus A)  LMP:           27w 0d        Date:  09/02/21                   EDD:   06/09/22  Best:          Altamese Cabal 4d     Det. By:  Marcella Dubs         EDD:   06/12/22                                      (10/30/21) ---------------------------------------------------------------------- Doppler - Fetal Vessels (Fetus A)  Umbilical Artery   S/D     %tile      RI    %tile      PI    %tile  PSV    ADFV    RDFV                                                     (cm/s)   2.52       16     0.6       15    0.86       14    29.64      No      No  ---------------------------------------------------------------------- Fetal Evaluation (Fetus B)  Num Of Fetuses:         2  Fetal Heart Rate(bpm):  145  Cardiac Activity:       Observed  Fetal Lie:              Maternal left side upper  Presentation:           Breech  Placenta:               Anterior                              Largest Pocket(cm)                              4.36 ---------------------------------------------------------------------- Gestational Age (Fetus B)  LMP:           27w 0d        Date:  09/02/21                   EDD:   06/09/22  Best:          Altamese Cabal 4d     Det. By:  Marcella Dubs         EDD:   06/12/22                                      (10/30/21) ---------------------------------------------------------------------- Doppler - Fetal Vessels (Fetus B)  Umbilical Artery   S/D     %tile      RI    %tile      PI    %tile     PSV    ADFV    RDFV                                                     (cm/s)   2.51       15     0.6       15    0.85       13    37.62      No      No ---------------------------------------------------------------------- Cervix Uterus Adnexa  Cervix  Length:              1  cm.    Funnel Length:        3  cm.  Funnel Width:        6  cm.  Dynamic changes are visualized ---------------------------------------------------------------------- Impression  Monochorionic-diamniotic twin pregnancy.  Twin A: Maternal  right, lower fetus, breech presentation,  anterior placenta.  Amniotic fluid is normal and good fetal  activity seen.  Fetal bladder is seen well.  Umbilical artery  Doppler showed normal forward diastolic flow.  NST is  reactive for this gestational age  32 B: Upper fetus, maternal left, breech presentation,  anterior placenta.  Amniotic fluid is normal and good fetal  activity seen.  Fetal bladder is seen well and appears normal.  Umbilical artery Doppler showed normal forward diastolic  flow.  NST is reactive for this gestational age.  No uterine  contractions were observed on NST monitoring.  NST was performed after ultrasound.  On transabdominal scan, cervix appears short with funneling.  After explaining, we performed a transvaginal ultrasound to  evaluate the cervix.  The shortest cervical length  measurement is 1 cm.  Umbilical cord was seen floating  between fetal presenting part and the internal os. Patient felt  uncomfortable during transvaginal ultrasound.  I did not,  therefore, performed sterile speculum examination.  xxxxxxxxxxxxxxxxxxxxxxxxxxxxxxxxxxxxxxxxxxxxxxxxxxxxxx  Consultation (see EPIC )  I had the pleasure of seeing Ms. Babilonia [facility]. She is G2 P1001 at 27-weeks'  gestation and is here for umbilical artery Doppler study.  Her high risk problems include:  -Monochorionic-diamniotic twin pregnancy with selective fetal  growth restriction of twin B.  On ultrasound performed 2  weeks ago, the estimated fetal weight of twin B was at the 9th  percentile.  -Pregestational diabetes.  Patient takes insulin for control.  Patient has been irregular in taking insulin.  On cell-free fetal DNA screening, the risks of fetal  aneuploidies are not increased (according to the patient).  She recently moved to [place] from [place].  She had MFM consultation at her previous visit.  Patient reports intermittent uterine contractions but no history  of vaginal bleeding or leakage of amniotic fluid.  I counseled the couple on the following  Cervical shortening  Cervical shortening and twin pregnancy are associated with  increased risk of preterm delivery.  I explained the  significance of transvaginal ultrasound findings.  Patient  reports irregular contractions at home and this is likely to  increase the risk of preterm premature rupture  membranes/preterm delivery.  Umbilical cord overlying the  cervix is of special concern because of the risk of cord  prolapse with breech presentation if she has rupture of  membranes.   I discussed the benefit of antenatal corticosteroids.  Because  of inadequate treatment, diabetes is likely to be poorly  controlled.  I recommended that she be evaluated as an inpatient to  monitor her blood glucose while on steroids.  Patient agreed with my recommendations. Discussed with Dr.  Despina Hidden.  Patient to be admitted in [facility] specialty. ---------------------------------------------------------------------- Recommendations  -Inpatient management with a course of antenatal  corticosteroids.  -Diabetes control.  -When discharge is considered, I recommend a limited  ultrasound evaluation to assess the lower uterine segment for  the presence of umbilical cord.  -Fetal growth assessment next week. ----------------------------------------------------------------------                 Noralee Space, MD Electronically Signed Final Report   03/10/2022 01:30 pm ----------------------------------------------------------------------  [place] MFM OB LIMITED  Result Date: 03/10/2022 ----------------------------------------------------------------------  OBSTETRICS REPORT                       (Signed Final 03/10/2022 01:30 pm) ---------------------------------------------------------------------- Patient Info  ID #:  [id number]                          D.O.B.:  [birth year] (21 yrs)  Name:       Katelyn White                Visit Date: 03/10/2022 10:48 am ---------------------------------------------------------------------- Performed By  Attending:        Noralee Space MD        Ref. Address:     [address]                                                             [address]                                                             [place]                                                             [address]  Performed By:     Burt Knack RDMS     [facility]         [facility]  Referred By:      [facility] ---------------------------------------------------------------------- Orders  #  Description                           Code  Ordered By  1  [place] MFM UA CORD DOPPLER                76820.02    YU FANG  2  [place] MFM OB LIMITED                     76815.01    YU FANG  3  [place] MFM UA ADDL GEST                   76820.01    YU FANG  4  [place] MFM OB TRANSVAGINAL                76817.2     YU FANG ----------------------------------------------------------------------  #  Order #                     Accession #                Episode #  1  [id number]                   [id number]                 [id number]  2  [id number]                   [id number]                 [id number]  3  [id number]                   [id number]                 [id number]  4  [id number]                   [id number]                 [id number] ---------------------------------------------------------------------- Indications  Twin pregnancy, mono/di, second trimester      O30.032  [redacted] weeks gestation of pregnancy                Z3A.26  Maternal care for known or suspected poor      O36.5921  fetal growth, second trimester, fetus 1 IUGR  Gestational diabetes in pregnancy, insulin     O24.414  controlled ---------------------------------------------------------------------- Fetal Evaluation (Fetus A)  Num Of Fetuses:         2  Fetal Heart Rate(bpm):  149  Cardiac Activity:       Observed  Fetal Lie:              Maternal right side lower  Presentation:           Breech  Placenta:               Anterior  Amniotic Fluid  AFI FV:      Within normal limits                              Largest Pocket(cm)                              3.71 ---------------------------------------------------------------------- OB History  Gravidity:    2         Term:   1  Living:       1  ---------------------------------------------------------------------- Gestational Age (Fetus A)  LMP:  27w 0d        Date:  09/02/21                   EDD:   06/09/22  Best:          Altamese Cabal 4d     Det. ByMarcella Dubs         EDD:   06/12/22                                      (10/30/21) ---------------------------------------------------------------------- Doppler - Fetal Vessels (Fetus A)  Umbilical Artery   S/D     %tile      RI    %tile      PI    %tile     PSV    ADFV    RDFV                                                     (cm/s)   2.52       16     0.6       15    0.86       14    29.64      No      No ---------------------------------------------------------------------- Fetal Evaluation (Fetus B)  Num Of Fetuses:         2  Fetal Heart Rate(bpm):  145  Cardiac Activity:       Observed  Fetal Lie:              Maternal left side upper  Presentation:           Breech  Placenta:               Anterior                              Largest Pocket(cm)                              4.36 ---------------------------------------------------------------------- Gestational Age (Fetus B)  LMP:           27w 0d        Date:  09/02/21                   EDD:   06/09/22  Best:          Altamese Cabal 4d     Det. By:  Marcella Dubs         EDD:   06/12/22                                      (10/30/21) ---------------------------------------------------------------------- Doppler - Fetal Vessels (Fetus B)  Umbilical Artery   S/D     %tile      RI    %tile      PI    %tile     PSV    ADFV    RDFV                                                     (  cm/s)   2.51       15     0.6       15    0.85       13    37.62      No      No ---------------------------------------------------------------------- Cervix Uterus Adnexa  Cervix  Length:              1  cm.    Funnel Length:        3  cm.  Funnel Width:        6  cm.  Dynamic changes are visualized ----------------------------------------------------------------------  Impression  Monochorionic-diamniotic twin pregnancy.  Twin A: Maternal right, lower fetus, breech presentation,  anterior placenta.  Amniotic fluid is normal and good fetal  activity seen.  Fetal bladder is seen well.  Umbilical artery  Doppler showed normal forward diastolic flow.  NST is  reactive for this gestational age  86 B: Upper fetus, maternal left, breech presentation,  anterior placenta.  Amniotic fluid is normal and good fetal  activity seen.  Fetal bladder is seen well and appears normal.  Umbilical artery Doppler showed normal forward diastolic  flow.  NST is reactive for this gestational age.  No uterine contractions were observed on NST monitoring.  NST was performed after ultrasound.  On transabdominal scan, cervix appears short with funneling.  After explaining, we performed a transvaginal ultrasound to  evaluate the cervix.  The shortest cervical length  measurement is 1 cm.  Umbilical cord was seen floating  between fetal presenting part and the internal os. Patient felt  uncomfortable during transvaginal ultrasound.  I did not,  therefore, performed sterile speculum examination.  xxxxxxxxxxxxxxxxxxxxxxxxxxxxxxxxxxxxxxxxxxxxxxxxxxxxxx  Consultation (see EPIC )  I had the pleasure of seeing Ms. Crosby [facility]. She is G2 P1001 at 27-weeks'  gestation and is here for umbilical artery Doppler study.  Her high risk problems include:  -Monochorionic-diamniotic twin pregnancy with selective fetal  growth restriction of twin B.  On ultrasound performed 2  weeks ago, the estimated fetal weight of twin B was at the 9th  percentile.  -Pregestational diabetes.  Patient takes insulin for control.  Patient has been irregular in taking insulin.  On cell-free fetal DNA screening, the risks of fetal  aneuploidies are not increased (according to the patient).  She recently moved to [place] from [place].  She had MFM consultation at her previous visit.  Patient reports  intermittent uterine contractions but no history  of vaginal bleeding or leakage of amniotic fluid.  I counseled the couple on the following  Cervical shortening  Cervical shortening and twin pregnancy are associated with  increased risk of preterm delivery.  I explained the  significance of transvaginal ultrasound findings.  Patient  reports irregular contractions at home and this is likely to  increase the risk of preterm premature rupture  membranes/preterm delivery.  Umbilical cord overlying the  cervix is of special concern because of the risk of cord  prolapse with breech presentation if she has rupture of  membranes.  I discussed the benefit of antenatal corticosteroids.  Because  of inadequate treatment, diabetes is likely to be poorly  controlled.  I recommended that she be evaluated as an inpatient to  monitor her blood glucose while on steroids.  Patient agreed with my recommendations. Discussed with Dr.  Despina Hidden.  Patient to be admitted in [facility] specialty. ---------------------------------------------------------------------- Recommendations  -Inpatient management with a  course of antenatal  corticosteroids.  -Diabetes control.  -When discharge is considered, I recommend a limited  ultrasound evaluation to assess the lower uterine segment for  the presence of umbilical cord.  -Fetal growth assessment next week. ----------------------------------------------------------------------                 Noralee Space, MD Electronically Signed Final Report   03/10/2022 01:30 pm ----------------------------------------------------------------------  [place] MFM UA ADDL GEST  Result Date: 03/10/2022 ----------------------------------------------------------------------  OBSTETRICS REPORT                       (Signed Final 03/10/2022 01:30 pm) ---------------------------------------------------------------------- Patient Info  ID #:       [id number]                          D.O.B.:  06-13-2001 (21 yrs)  Name:       Katelyn White                Visit Date: 03/10/2022 10:48 am ---------------------------------------------------------------------- Performed By  Attending:        Noralee Space MD        Ref. Address:     [address]                                                             [address]                                                             [place]                                                             [address]  Performed By:     Burt Knack RDMS     [facility]         [facility]  Referred By:      [facility] ---------------------------------------------------------------------- Orders  #  Description                           Code        Ordered By  1  [place] MFM UA CORD DOPPLER                76820.02    YU FANG  2  [place] MFM OB LIMITED                     76815.01    YU FANG  3  [place] MFM UA ADDL GEST                   76820.01    YU FANG  4  [place] MFM OB TRANSVAGINAL                [id number]     YU FANG ----------------------------------------------------------------------  #  Order #                     Accession #                Episode #  1  [id number]                   [id number]                 [id number]  2  [id number]                   [id number]                 [id number]  3  [id number]                   [id number]                 [id number]  4  [id number]                   [id number]                 [id number] ---------------------------------------------------------------------- Indications  Twin pregnancy, mono/di, second trimester      O30.032  [redacted] weeks gestation of pregnancy                Z3A.26  Maternal care for known or suspected poor      O36.5921  fetal growth, second trimester, fetus 1 IUGR  Gestational diabetes in  pregnancy, insulin     O24.414  controlled ---------------------------------------------------------------------- Fetal Evaluation (Fetus A)  Num Of Fetuses:         2  Fetal Heart Rate(bpm):  149  Cardiac Activity:       Observed  Fetal Lie:              Maternal right side lower  Presentation:           Breech  Placenta:               Anterior  Amniotic Fluid  AFI FV:      Within normal limits                              Largest Pocket(cm)                              3.71 ---------------------------------------------------------------------- OB History  Gravidity:    2         Term:   1  Living:       1 ---------------------------------------------------------------------- Gestational Age (Fetus A)  LMP:           27w 0d        Date:  09/02/21                   EDD:   06/09/22  Best:          Altamese Cabal 4d     Det. ByMarcella Dubs         EDD:   06/12/22                                      (10/30/21) ---------------------------------------------------------------------- Doppler - Fetal Vessels (Fetus A)  Umbilical Artery   S/D     %tile      RI    %tile      PI    %tile     PSV    ADFV    RDFV                                                     (cm/s)   2.52       16     0.6       15    0.86       14    29.64      No      No ---------------------------------------------------------------------- Fetal Evaluation (Fetus B)  Num Of Fetuses:         2  Fetal Heart Rate(bpm):  145  Cardiac Activity:       Observed  Fetal Lie:              Maternal left side upper  Presentation:           Breech  Placenta:               Anterior                              Largest Pocket(cm)                              4.36 ---------------------------------------------------------------------- Gestational Age (Fetus B)  LMP:           27w 0d        Date:  09/02/21                   EDD:   06/09/22  Best:          Altamese Cabal 4d     Det. By:  Marcella Dubs  EDD:   06/12/22                                      (10/30/21)  ---------------------------------------------------------------------- Doppler - Fetal Vessels (Fetus B)  Umbilical Artery   S/D     %tile      RI    %tile      PI    %tile     PSV    ADFV    RDFV                                                     (cm/s)   2.51       15     0.6       15    0.85       13    37.62      No      No ---------------------------------------------------------------------- Cervix Uterus Adnexa  Cervix  Length:              1  cm.    Funnel Length:        3  cm.  Funnel Width:        6  cm.  Dynamic changes are visualized ---------------------------------------------------------------------- Impression  Monochorionic-diamniotic twin pregnancy.  Twin A: Maternal right, lower fetus, breech presentation,  anterior placenta.  Amniotic fluid is normal and good fetal  activity seen.  Fetal bladder is seen well.  Umbilical artery  Doppler showed normal forward diastolic flow.  NST is  reactive for this gestational age  58 B: Upper fetus, maternal left, breech presentation,  anterior placenta.  Amniotic fluid is normal and good fetal  activity seen.  Fetal bladder is seen well and appears normal.  Umbilical artery Doppler showed normal forward diastolic  flow.  NST is reactive for this gestational age.  No uterine contractions were observed on NST monitoring.  NST was performed after ultrasound.  On transabdominal scan, cervix appears short with funneling.  After explaining, we performed a transvaginal ultrasound to  evaluate the cervix.  The shortest cervical length  measurement is 1 cm.  Umbilical cord was seen floating  between fetal presenting part and the internal os. Patient felt  uncomfortable during transvaginal ultrasound.  I did not,  therefore, performed sterile speculum examination.  xxxxxxxxxxxxxxxxxxxxxxxxxxxxxxxxxxxxxxxxxxxxxxxxxxxxxx  Consultation (see EPIC )  I had the pleasure of seeing Ms. Mccrae [facility]. She is G2 P1001 at 27-weeks'   gestation and is here for umbilical artery Doppler study.  Her high risk problems include:  -Monochorionic-diamniotic twin pregnancy with selective fetal  growth restriction of twin B.  On ultrasound performed 2  weeks ago, the estimated fetal weight of twin B was at the 9th  percentile.  -Pregestational diabetes.  Patient takes insulin for control.  Patient has been irregular in taking insulin.  On cell-free fetal DNA screening, the risks of fetal  aneuploidies are not increased (according to the patient).  She recently moved to [place] from [place].  She had MFM consultation at her previous visit.  Patient reports intermittent uterine contractions but no history  of vaginal bleeding or leakage of amniotic fluid.  I counseled the couple on the following  Cervical shortening  Cervical shortening and twin pregnancy are associated with  increased risk of preterm delivery.  I explained the  significance of transvaginal ultrasound findings.  Patient  reports irregular contractions at home and this is likely to  increase the risk of preterm premature rupture  membranes/preterm delivery.  Umbilical cord overlying the  cervix is of special concern because of the risk of cord  prolapse with breech presentation if she has rupture of  membranes.  I discussed the benefit of antenatal corticosteroids.  Because  of inadequate treatment, diabetes is likely to be poorly  controlled.  I recommended that she be evaluated as an inpatient to  monitor her blood glucose while on steroids.  Patient agreed with my recommendations. Discussed with Dr.  Despina Hidden.  Patient to be admitted in [facility] specialty. ---------------------------------------------------------------------- Recommendations  -Inpatient management with a course of antenatal  corticosteroids.  -Diabetes control.  -When discharge is considered, I recommend a limited  ultrasound evaluation to assess the lower uterine segment for  the presence of umbilical cord.  -Fetal growth  assessment next week. ----------------------------------------------------------------------                 Noralee Space, MD Electronically Signed Final Report   03/10/2022 01:30 pm ----------------------------------------------------------------------  [place] MFM OB Transvaginal  Result Date: 03/10/2022 ----------------------------------------------------------------------  OBSTETRICS REPORT                       (Signed Final 03/10/2022 01:30 pm) ---------------------------------------------------------------------- Patient Info  ID #:       [id number]                          D.O.B.:  [birth year] (21 yrs)  Name:       Katelyn White                Visit Date: 03/10/2022 10:48 am ---------------------------------------------------------------------- Performed By  Attending:        Noralee Space MD        Ref. Address:     [address]                                                             [address]                                                             [place]  [address]  Performed By:     Burt Knack RDMS     [facility]  Referred By:      [facility] ---------------------------------------------------------------------- Orders  #  Description                           Code        Ordered By  1  [place] MFM UA CORD DOPPLER                76820.02    YU FANG  2  [place] MFM OB LIMITED                     76815.01    YU FANG  3  [place] MFM UA ADDL GEST                   76820.01    YU FANG  4  [place] MFM OB TRANSVAGINAL                96789.3     YU FANG ----------------------------------------------------------------------  #  Order #                     Accession  #                Episode #  1  [id number]                   [id number]                 [id number]  2  [id number]                   [id number]                 [id number]  3  [id number]                   [id number]                 [id number]  4  [id number]                   [id number]                 [id number] ---------------------------------------------------------------------- Indications  Twin pregnancy, mono/di,  second trimester      O30.032  [redacted] weeks gestation of pregnancy                Z3A.26  Maternal care for known or suspected poor      O36.5921  fetal growth, second trimester, fetus 1 IUGR  Gestational diabetes in pregnancy, insulin     O24.414  controlled ---------------------------------------------------------------------- Fetal Evaluation (Fetus A)  Num Of Fetuses:         2  Fetal Heart Rate(bpm):  149  Cardiac Activity:       Observed  Fetal Lie:              Maternal right side lower  Presentation:           Breech  Placenta:               Anterior  Amniotic Fluid  AFI FV:      Within normal limits                              Largest Pocket(cm)                              3.71 ---------------------------------------------------------------------- OB History  Gravidity:    2         Term:   1  Living:       1 ---------------------------------------------------------------------- Gestational Age (Fetus A)  LMP:           27w 0d        Date:  09/02/21                   EDD:   06/09/22  Best:          Altamese Cabal26w 4d     Det. ByMarcella Dubs:  Early Ultrasound         EDD:   06/12/22                                      (10/30/21) ---------------------------------------------------------------------- Doppler - Fetal Vessels (Fetus A)  Umbilical Artery   S/D     %tile      RI    %tile      PI    %tile     PSV    ADFV    RDFV                                                     (cm/s)   2.52       16     0.6       15    0.86       14    29.64      No      No ---------------------------------------------------------------------- Fetal  Evaluation (Fetus B)  Num Of Fetuses:         2  Fetal Heart Rate(bpm):  145  Cardiac Activity:       Observed  Fetal Lie:              Maternal left side upper  Presentation:           Breech  Placenta:  Anterior                              Largest Pocket(cm)                              4.36 ---------------------------------------------------------------------- Gestational Age (Fetus B)  LMP:           27w 0d        Date:  09/02/21                   EDD:   06/09/22  Best:          Altamese Cabal 4d     Det. By:  Marcella Dubs         EDD:   06/12/22                                      (10/30/21) ---------------------------------------------------------------------- Doppler - Fetal Vessels (Fetus B)  Umbilical Artery   S/D     %tile      RI    %tile      PI    %tile     PSV    ADFV    RDFV                                                     (cm/s)   2.51       15     0.6       15    0.85       13    37.62      No      No ---------------------------------------------------------------------- Cervix Uterus Adnexa  Cervix  Length:              1  cm.    Funnel Length:        3  cm.  Funnel Width:        6  cm.  Dynamic changes are visualized ---------------------------------------------------------------------- Impression  Monochorionic-diamniotic twin pregnancy.  Twin A: Maternal right, lower fetus, breech presentation,  anterior placenta.  Amniotic fluid is normal and good fetal  activity seen.  Fetal bladder is seen well.  Umbilical artery  Doppler showed normal forward diastolic flow.  NST is  reactive for this gestational age  50 B: Upper fetus, maternal left, breech presentation,  anterior placenta.  Amniotic fluid is normal and good fetal  activity seen.  Fetal bladder is seen well and appears normal.  Umbilical artery Doppler showed normal forward diastolic  flow.  NST is reactive for this gestational age.  No uterine contractions were observed on NST monitoring.  NST was performed after ultrasound.  On  transabdominal scan, cervix appears short with funneling.  After explaining, we performed a transvaginal ultrasound to  evaluate the cervix.  The shortest cervical length  measurement is 1 cm.  Umbilical cord was seen floating  between fetal presenting part and the internal os. Patient felt  uncomfortable during transvaginal ultrasound.  I did not,  therefore, performed sterile speculum examination.  xxxxxxxxxxxxxxxxxxxxxxxxxxxxxxxxxxxxxxxxxxxxxxxxxxxxxx  Consultation (see EPIC )  I had the pleasure of seeing Ms. Penninger [facility]. She is G2 P1001 at 27-weeks'  gestation and is here for umbilical artery Doppler study.  Her high risk problems include:  -Monochorionic-diamniotic twin pregnancy with selective fetal  growth restriction of twin B.  On ultrasound performed 2  weeks ago, the estimated fetal weight of twin B was at the 9th  percentile.  -Pregestational diabetes.  Patient takes insulin for control.  Patient has been irregular in taking insulin.  On cell-free fetal DNA screening, the risks of fetal  aneuploidies are not increased (according to the patient).  She recently moved to [place] from [place].  She had MFM consultation at her previous visit.  Patient reports intermittent uterine contractions but no history  of vaginal bleeding or leakage of amniotic fluid.  I counseled the couple on the following  Cervical shortening  Cervical shortening and twin pregnancy are associated with  increased risk of preterm delivery.  I explained the  significance of transvaginal ultrasound findings.  Patient  reports irregular contractions at home and this is likely to  increase the risk of preterm premature rupture  membranes/preterm delivery.  Umbilical cord overlying the  cervix is of special concern because of the risk of cord  prolapse with breech presentation if she has rupture of  membranes.  I discussed the benefit of antenatal corticosteroids.  Because  of inadequate  treatment, diabetes is likely to be poorly  controlled.  I recommended that she be evaluated as an inpatient to  monitor her blood glucose while on steroids.  Patient agreed with my recommendations. Discussed with Dr.  Despina Hidden.  [facility]. ---------------------------------------------------------------------- Recommendations  -Inpatient management with a course of antenatal  corticosteroids.  -Diabetes control.  -When discharge is considered, I recommend a limited  ultrasound evaluation to assess the lower uterine segment for  the presence of umbilical cord.  -Fetal growth assessment next week. ----------------------------------------------------------------------                 Noralee Space, MD Electronically Signed Final Report   03/10/2022 01:30 pm ----------------------------------------------------------------------    Medications:  Scheduled  docusate sodium  100 mg Oral Daily   enoxaparin (LOVENOX) injection  40 mg Subcutaneous Q24H   insulin aspart  12 Units Subcutaneous TID WC   insulin glargine-yfgn  54 Units Subcutaneous QHS   NIFEdipine  30 mg Oral Daily   prenatal multivitamin  1 tablet Oral Q1200   sodium chloride flush  3 mL Intravenous Q12H   I have reviewed the patient's current medications.  ASSESSMENT: G2P1001 [redacted]w[redacted]d Estimated Date of Delivery: 06/09/22  MOno/Di twins FGR B 9% Premature cervical change, uterine activity related on procardia A2 DM, on lantus/novolog   PLAN: >S/P BMZ x 2 >CBG being pretty well controlled given her steroid dosing >Continue procardia >Reassess lower uterine segment/cervix + cord in am in anticipation of discharge possibly  Katelyn White 03/12/2022,11:14 AM

## 2022-03-13 ENCOUNTER — Inpatient Hospital Stay (HOSPITAL_BASED_OUTPATIENT_CLINIC_OR_DEPARTMENT_OTHER): Payer: Medicaid Other

## 2022-03-13 ENCOUNTER — Other Ambulatory Visit: Payer: Self-pay | Admitting: Obstetrics & Gynecology

## 2022-03-13 DIAGNOSIS — O24312 Unspecified pre-existing diabetes mellitus in pregnancy, second trimester: Secondary | ICD-10-CM

## 2022-03-13 DIAGNOSIS — O30032 Twin pregnancy, monochorionic/diamniotic, second trimester: Secondary | ICD-10-CM | POA: Diagnosis not present

## 2022-03-13 DIAGNOSIS — O24414 Gestational diabetes mellitus in pregnancy, insulin controlled: Secondary | ICD-10-CM

## 2022-03-13 DIAGNOSIS — O26873 Cervical shortening, third trimester: Secondary | ICD-10-CM

## 2022-03-13 DIAGNOSIS — Z3A27 27 weeks gestation of pregnancy: Secondary | ICD-10-CM

## 2022-03-13 DIAGNOSIS — O365920 Maternal care for other known or suspected poor fetal growth, second trimester, not applicable or unspecified: Secondary | ICD-10-CM

## 2022-03-13 LAB — GLUCOSE, CAPILLARY: Glucose-Capillary: 89 mg/dL (ref 70–99)

## 2022-03-13 MED ORDER — INSULIN LISPRO (1 UNIT DIAL) 100 UNIT/ML (KWIKPEN): 12.0000 [IU] | PEN_INJECTOR | Freq: Three times a day (TID) | SUBCUTANEOUS | 11 refills | Status: DC

## 2022-03-13 MED ORDER — BLOOD GLUCOSE TEST STRIPS 333 VI STRP: 1.0000 | ORAL_STRIP | Freq: Four times a day (QID) | 6 refills | Status: AC

## 2022-03-13 MED ORDER — PEN NEEDLES 32G X 5 MM MISC: 6 refills | Status: DC

## 2022-03-13 MED ORDER — NIFEDIPINE ER 30 MG PO TB24: 30.0000 mg | ORAL_TABLET | Freq: Every day | ORAL | 2 refills | Status: DC

## 2022-03-13 MED ORDER — INSULIN GLARGINE 100 UNIT/ML SOLOSTAR PEN: 54.0000 [IU] | PEN_INJECTOR | Freq: Every day | SUBCUTANEOUS | 11 refills | Status: DC

## 2022-03-13 NOTE — Plan of Care (Signed)
Pt to be discharged home with printed instructions. Dorothee Napierkowski L Gaye Scorza, RN  

## 2022-03-13 NOTE — Discharge Summary (Signed)
Physician Discharge Summary  Patient ID: Katelyn White MRN: [id number] DOB/AGE: 2000/10/23 21 y.o.  Admit date: 03/10/2022 Discharge date: 03/13/2022  Admission Diagnoses:Mo/Di twins A2DM Cervical shortening  Discharge Diagnoses:  Principal Problem:   Short cervical length during pregnancy in second trimester Active Problems:   Pre-existing diabetes mellitus in pregnancy   Monochorionic diamniotic twin gestation in second trimester   Maternal care for restricted fetal growth, antepartum   Discharged Condition: good  Hospital Course: Admitted to manage CBG while receiving steroids. MoDi twins twin B FGR9%, no TTTS is present, also twin B cord was over cervix, repeat reveals cervix has lengthened from 1.0 to 1.4-17 cm with pressure and the cord in the lower segment has resolved.  Discussed with Dr Donalee Citrin who originally suggested admission and he agrees with discharge  Consults: None  Significant Diagnostic Studies: sonogram  Treatments: CBG control on steroids  Discharge Exam: Blood pressure (!) 97/40, pulse 82, temperature 97.8 F (36.6 C), temperature source Oral, resp. rate 17, height 5' 4"  (1.626 m), weight 95 kg, last menstrual period 09/02/2021, SpO2 98 %, not currently breastfeeding. General appearance: alert, cooperative, and no distress GI: soft, non-tender; bowel sounds normal; no masses,  no organomegaly  Disposition: Discharge disposition: 01-Home or Self Care       Discharge Instructions     Call MD for:  persistant nausea and vomiting   Complete by: As directed    Call MD for:  severe uncontrolled pain   Complete by: As directed    Call MD for:  temperature >100.4   Complete by: As directed    Diet - low sodium heart healthy   Complete by: As directed    Increase activity slowly   Complete by: As directed       Allergies as of 03/13/2022   No Known Allergies      Medication List     TAKE these medications    Blood Pressure Kit Devi 1  kit by Does not apply route once a week. Check Blood Pressure regularly and record readings into the [employer] App.  Large Cuff.  DX O90.0   cyclobenzaprine 10 MG tablet Commonly known as: FLEXERIL Take 1 tablet (10 mg total) by mouth 2 (two) times daily as needed for muscle spasms.   insulin glargine 100 UNIT/ML Solostar Pen Commonly known as: LANTUS Inject 54 Units into the skin daily. What changed: how much to take   insulin lispro 100 UNIT/ML KwikPen Commonly known as: HumaLOG KwikPen Inject 12 Units into the skin with breakfast, with lunch, and with evening meal. What changed: how much to take   NIFEdipine 30 MG 24 hr tablet Commonly known as: ADALAT CC Take 1 tablet (30 mg total) by mouth daily.   Pen Needles 32G X 5 MM Misc Use one needle with each injection and then discard   prenatal multivitamin Tabs tablet Take 1 tablet by mouth daily at 12 noon.        Follow-up [facility] at [facility] Follow up on 03/24/2022.   Specialty: Maternal and Fetal Medicine Why: keep scheduled Contact information: [address] [place] [address] [phone]                Signed: Florian Buff 03/13/2022, 11:01 AM

## 2022-03-17 ENCOUNTER — Ambulatory Visit: Payer: Medicaid Other

## 2022-03-24 ENCOUNTER — Ambulatory Visit: Payer: Medicaid Other | Attending: Obstetrics and Gynecology

## 2022-03-24 ENCOUNTER — Other Ambulatory Visit: Payer: Self-pay | Admitting: *Deleted

## 2022-03-24 ENCOUNTER — Ambulatory Visit: Payer: Medicaid Other | Admitting: *Deleted

## 2022-03-24 ENCOUNTER — Ambulatory Visit: Payer: Medicaid Other

## 2022-03-24 VITALS — BP 131/74 | HR 105

## 2022-03-24 DIAGNOSIS — O365932 Maternal care for other known or suspected poor fetal growth, third trimester, fetus 2: Secondary | ICD-10-CM | POA: Diagnosis not present

## 2022-03-24 DIAGNOSIS — O24414 Gestational diabetes mellitus in pregnancy, insulin controlled: Secondary | ICD-10-CM | POA: Diagnosis present

## 2022-03-24 DIAGNOSIS — O30033 Twin pregnancy, monochorionic/diamniotic, third trimester: Secondary | ICD-10-CM

## 2022-03-24 DIAGNOSIS — O26873 Cervical shortening, third trimester: Secondary | ICD-10-CM

## 2022-03-24 DIAGNOSIS — O30032 Twin pregnancy, monochorionic/diamniotic, second trimester: Secondary | ICD-10-CM

## 2022-03-24 DIAGNOSIS — Z3A28 28 weeks gestation of pregnancy: Secondary | ICD-10-CM

## 2022-03-31 ENCOUNTER — Encounter: Payer: Self-pay | Admitting: Obstetrics and Gynecology

## 2022-03-31 ENCOUNTER — Ambulatory Visit (INDEPENDENT_AMBULATORY_CARE_PROVIDER_SITE_OTHER): Payer: Medicaid Other | Admitting: Obstetrics and Gynecology

## 2022-03-31 ENCOUNTER — Other Ambulatory Visit: Payer: Self-pay

## 2022-03-31 VITALS — BP 117/71 | HR 78 | Wt 215.0 lb

## 2022-03-31 DIAGNOSIS — O26872 Cervical shortening, second trimester: Secondary | ICD-10-CM | POA: Diagnosis not present

## 2022-03-31 DIAGNOSIS — O365990 Maternal care for other known or suspected poor fetal growth, unspecified trimester, not applicable or unspecified: Secondary | ICD-10-CM

## 2022-03-31 DIAGNOSIS — O24312 Unspecified pre-existing diabetes mellitus in pregnancy, second trimester: Secondary | ICD-10-CM | POA: Diagnosis not present

## 2022-03-31 DIAGNOSIS — O30032 Twin pregnancy, monochorionic/diamniotic, second trimester: Secondary | ICD-10-CM | POA: Diagnosis not present

## 2022-03-31 DIAGNOSIS — Z3480 Encounter for supervision of other normal pregnancy, unspecified trimester: Secondary | ICD-10-CM | POA: Diagnosis not present

## 2022-03-31 DIAGNOSIS — Z23 Encounter for immunization: Secondary | ICD-10-CM

## 2022-03-31 NOTE — Progress Notes (Signed)
Subjective:  Katelyn White is a 21 y.o. G2P1001 at [redacted]w[redacted]d being seen today for ongoing prenatal care. Transferred fro [facility] due to twins.  She is currently monitored for the following issues for this high-risk pregnancy and has Supervision of other normal pregnancy, antepartum; Pre-existing diabetes mellitus in pregnancy; Monochorionic diamniotic twin gestation; Monochorionic diamniotic twin gestation in second trimester; Short cervical length during pregnancy in second trimester; and Maternal care for restricted fetal growth, antepartum on their problem list.  Patient reports no complaints.  Contractions: Irritability. Vag. Bleeding: None.  Movement: Present. Denies leaking of fluid.   The following portions of the patient's history were reviewed and updated as appropriate: allergies, current medications, past family history, past medical history, past social history, past surgical history and problem list. Problem list updated.  Objective:   Vitals:   03/31/22 0833  BP: 117/71  Pulse: 78  Weight: 215 lb (97.5 kg)    Fetal Status: Fetal Heart Rate (bpm): 136/145 Fundal Height: 34 cm Movement: Present     General:  Alert, oriented and cooperative. Patient is in no acute distress.  Skin: Skin is warm and dry. No rash noted.   Cardiovascular: Normal heart rate noted  Respiratory: Normal respiratory effort, no problems with respiration noted  Abdomen: Soft, gravid, appropriate for gestational age. Pain/Pressure: Present     Pelvic:  Cervical exam deferred        Extremities: Normal range of motion.  Edema: None  Mental Status: Normal mood and affect. Normal behavior. Normal judgment and thought content.   Urinalysis:      Assessment and Plan:  Pregnancy: G2P1001 at [redacted]w[redacted]d  1. Supervision of other normal pregnancy, antepartum Stable - CBC - HIV Antibody (routine testing w rflx) - RPR - Tdap vaccine greater than or equal to 7yo IM  2. Monochorionic diamniotic twin gestation in  second trimester Serial growth scans and antenatal testing as per MFM  3. Short cervical length during pregnancy in second trimester Stable Only takes Procardia Prn and has not had to use for several weeks now S/P BMZ  4. Pre-existing diabetes mellitus during pregnancy in second trimester Has not been taking insulin due to BMZ. Pt reports fasting CBG's 110's and 2 hr PP 120-130's Will restart qhs Lantus 10 units, and Humalog 2 units with meals Continue with serial growth scans and antenatal testing as per MFM  5. Maternal care for restricted fetal growth, antepartum As noted above  Preterm labor symptoms and general obstetric precautions including but not limited to vaginal bleeding, contractions, leaking of fluid and fetal movement were reviewed in detail with the patient. Please refer to After Visit Summary for other counseling recommendations.  Return in about 2 weeks (around 04/14/2022) for OB visit, face to face, MD only, prefers females.   Chancy Milroy, MD

## 2022-03-31 NOTE — Patient Instructions (Signed)
Tdap (Tetanus, Diphtheria, Pertussis) Vaccine: What You Need to Know 1. Why get vaccinated? Tdap vaccine can prevent tetanus, diphtheria, and pertussis. Diphtheria and pertussis spread from person to person. Tetanus enters the body through cuts or wounds. TETANUS (T) causes painful stiffening of the muscles. Tetanus can lead to serious health problems, including being unable to open the mouth, having trouble swallowing and breathing, or death. DIPHTHERIA (D) can lead to difficulty breathing, heart failure, paralysis, or death. PERTUSSIS (aP), also known as "whooping cough," can cause uncontrollable, violent coughing that makes it hard to breathe, eat, or drink. Pertussis can be extremely serious especially in babies and young children, causing pneumonia, convulsions, brain damage, or death. In teens and adults, it can cause weight loss, loss of bladder control, passing out, and rib fractures from severe coughing. 2. Tdap vaccine Tdap is only for children 7 years and older, adolescents, and adults.  Adolescents should receive a single dose of Tdap, preferably at age 11 or 12 years. Pregnant people should get a dose of Tdap during every pregnancy, preferably during the early part of the third trimester, to help protect the newborn from pertussis. Infants are most at risk for severe, life-threatening complications from pertussis. Adults who have never received Tdap should get a dose of Tdap. Also, adults should receive a booster dose of either Tdap or Td (a different vaccine that protects against tetanus and diphtheria but not pertussis) every 10 years, or after 5 years in the case of a severe or dirty wound or burn. Tdap may be given at the same time as other vaccines. 3. Talk with your health care provider Tell your vaccine provider if the person getting the vaccine: Has had an allergic reaction after a previous dose of any vaccine that protects against tetanus, diphtheria, or pertussis, or has any  severe, life-threatening allergies Has had a coma, decreased level of consciousness, or prolonged seizures within 7 days after a previous dose of any pertussis vaccine (DTP, DTaP, or Tdap) Has seizures or another nervous system problem Has ever had Guillain-Barr Syndrome (also called "GBS") Has had severe pain or swelling after a previous dose of any vaccine that protects against tetanus or diphtheria In some cases, your health care provider may decide to postpone Tdap vaccination until a future visit. People with minor illnesses, such as a cold, may be vaccinated. People who are moderately or severely ill should usually wait until they recover before getting Tdap vaccine.  Your health care provider can give you more information. 4. Risks of a vaccine reaction Pain, redness, or swelling where the shot was given, mild fever, headache, feeling tired, and nausea, vomiting, diarrhea, or stomachache sometimes happen after Tdap vaccination. People sometimes faint after medical procedures, including vaccination. Tell your provider if you feel dizzy or have vision changes or ringing in the ears.  As with any medicine, there is a very remote chance of a vaccine causing a severe allergic reaction, other serious injury, or death. 5. What if there is a serious problem? An allergic reaction could occur after the vaccinated person leaves the clinic. If you see signs of a severe allergic reaction (hives, swelling of the face and throat, difficulty breathing, a fast heartbeat, dizziness, or weakness), call 9-1-1 and get the person to the nearest hospital. For other signs that concern you, call your health care provider.  Adverse reactions should be reported to the Vaccine Adverse Event Reporting System (VAERS). Your health care provider will usually file this report, or you   can do it yourself. Visit the VAERS website at www.vaers.hhs.gov or call 1-800-822-7967. VAERS is only for reporting reactions, and VAERS staff  members do not give medical advice. 6. The National Vaccine Injury Compensation Program The National Vaccine Injury Compensation Program (VICP) is a federal program that was created to compensate people who may have been injured by certain vaccines. Claims regarding alleged injury or death due to vaccination have a time limit for filing, which may be as short as two years. Visit the VICP website at www.hrsa.gov/vaccinecompensation or call 1-800-338-2382 to learn about the program and about filing a claim. 7. How can I learn more? Ask your health care provider. Call your local or state health department. Visit the website of the Food and Drug Administration (FDA) for vaccine package inserts and additional information at www.fda.gov/vaccines-blood-biologics/vaccines. Contact the Centers for Disease Control and Prevention (CDC): Call 1-800-232-4636 (1-800-CDC-INFO) or Visit CDC's website at www.cdc.gov/vaccines. Source: CDC Vaccine Information Statement Tdap (Tetanus, Diphtheria, Pertussis) Vaccine (02/03/2020) This same material is available at www.cdc.gov for no charge. This information is not intended to replace advice given to you by your health care provider. Make sure you discuss any questions you have with your health care provider. Document Revised: 05/15/2021 Document Reviewed: 03/18/2021 Elsevier Patient Education  2023 Elsevier Inc.  

## 2022-04-01 LAB — CBC
Hematocrit: 35.2 % (ref 34.0–46.6)
Hemoglobin: 10.9 g/dL — ABNORMAL LOW (ref 11.1–15.9)
MCH: 24.3 pg — ABNORMAL LOW (ref 26.6–33.0)
MCHC: 31 g/dL — ABNORMAL LOW (ref 31.5–35.7)
MCV: 79 fL (ref 79–97)
Platelets: 249 10*3/uL (ref 150–450)
RBC: 4.48 x10E6/uL (ref 3.77–5.28)
RDW: 15.2 % (ref 11.7–15.4)
WBC: 9.5 10*3/uL (ref 3.4–10.8)

## 2022-04-01 LAB — RPR: RPR Ser Ql: NONREACTIVE

## 2022-04-01 LAB — HIV ANTIBODY (ROUTINE TESTING W REFLEX): HIV Screen 4th Generation wRfx: NONREACTIVE

## 2022-04-07 ENCOUNTER — Ambulatory Visit: Payer: Medicaid Other

## 2022-04-10 ENCOUNTER — Other Ambulatory Visit: Payer: Self-pay | Admitting: *Deleted

## 2022-04-10 ENCOUNTER — Ambulatory Visit: Payer: Medicaid Other | Admitting: *Deleted

## 2022-04-10 ENCOUNTER — Ambulatory Visit: Payer: Medicaid Other | Attending: Obstetrics

## 2022-04-10 ENCOUNTER — Encounter: Payer: Self-pay | Admitting: *Deleted

## 2022-04-10 VITALS — BP 114/69 | HR 101

## 2022-04-10 DIAGNOSIS — Z3480 Encounter for supervision of other normal pregnancy, unspecified trimester: Secondary | ICD-10-CM | POA: Insufficient documentation

## 2022-04-10 DIAGNOSIS — O24414 Gestational diabetes mellitus in pregnancy, insulin controlled: Secondary | ICD-10-CM

## 2022-04-10 DIAGNOSIS — O365990 Maternal care for other known or suspected poor fetal growth, unspecified trimester, not applicable or unspecified: Secondary | ICD-10-CM | POA: Insufficient documentation

## 2022-04-10 DIAGNOSIS — O365932 Maternal care for other known or suspected poor fetal growth, third trimester, fetus 2: Secondary | ICD-10-CM | POA: Diagnosis not present

## 2022-04-10 DIAGNOSIS — O26872 Cervical shortening, second trimester: Secondary | ICD-10-CM

## 2022-04-10 DIAGNOSIS — Z3A31 31 weeks gestation of pregnancy: Secondary | ICD-10-CM

## 2022-04-10 DIAGNOSIS — O30033 Twin pregnancy, monochorionic/diamniotic, third trimester: Secondary | ICD-10-CM

## 2022-04-10 DIAGNOSIS — O26873 Cervical shortening, third trimester: Secondary | ICD-10-CM

## 2022-04-10 DIAGNOSIS — O30032 Twin pregnancy, monochorionic/diamniotic, second trimester: Secondary | ICD-10-CM

## 2022-04-11 ENCOUNTER — Encounter (HOSPITAL_COMMUNITY): Payer: Self-pay | Admitting: Family Medicine

## 2022-04-11 ENCOUNTER — Other Ambulatory Visit: Payer: Self-pay

## 2022-04-11 ENCOUNTER — Inpatient Hospital Stay (HOSPITAL_COMMUNITY)
Admission: AD | Admit: 2022-04-11 | Discharge: 2022-04-11 | Disposition: A | Discharge status: Home / Self Care | Payer: Medicaid Other | Attending: Family Medicine | Admitting: Family Medicine

## 2022-04-11 DIAGNOSIS — R102 Pelvic and perineal pain: Secondary | ICD-10-CM | POA: Diagnosis not present

## 2022-04-11 DIAGNOSIS — M549 Dorsalgia, unspecified: Secondary | ICD-10-CM

## 2022-04-11 DIAGNOSIS — Z3A31 31 weeks gestation of pregnancy: Secondary | ICD-10-CM | POA: Diagnosis not present

## 2022-04-11 DIAGNOSIS — O99891 Other specified diseases and conditions complicating pregnancy: Secondary | ICD-10-CM | POA: Diagnosis not present

## 2022-04-11 DIAGNOSIS — O30033 Twin pregnancy, monochorionic/diamniotic, third trimester: Secondary | ICD-10-CM | POA: Diagnosis not present

## 2022-04-11 DIAGNOSIS — O26893 Other specified pregnancy related conditions, third trimester: Secondary | ICD-10-CM

## 2022-04-11 DIAGNOSIS — O3430 Maternal care for cervical incompetence, unspecified trimester: Secondary | ICD-10-CM

## 2022-04-11 LAB — URINALYSIS, ROUTINE W REFLEX MICROSCOPIC
Bilirubin Urine: NEGATIVE
Glucose, UA: NEGATIVE mg/dL
Hgb urine dipstick: NEGATIVE
Ketones, ur: NEGATIVE mg/dL
Nitrite: NEGATIVE
Protein, ur: 30 mg/dL — AB
Specific Gravity, Urine: 1.019 (ref 1.005–1.030)
pH: 5 (ref 5.0–8.0)

## 2022-04-11 MED ORDER — CYCLOBENZAPRINE HCL 5 MG PO TABS
10.0000 mg | ORAL_TABLET | Freq: Once | ORAL | Status: AC
  Administered 2022-04-11: 10 mg via ORAL
  Filled 2022-04-11: qty 2

## 2022-04-11 MED ORDER — CYCLOBENZAPRINE HCL 10 MG PO TABS: 10.0000 mg | ORAL_TABLET | Freq: Three times a day (TID) | ORAL | 0 refills | Status: DC | PRN

## 2022-04-11 MED ORDER — ACETAMINOPHEN 500 MG PO TABS
1000.0000 mg | ORAL_TABLET | Freq: Once | ORAL | Status: AC
  Administered 2022-04-11: 1000 mg via ORAL
  Filled 2022-04-11: qty 2

## 2022-04-11 NOTE — MAU Note (Signed)
.  Katelyn White is a 21 y.o. at [redacted]w[redacted]d here in MAU reporting: Pt reports she is having new onset constant sharp back pain and abdomen Denies vaginal bleeding or LOF.  Reports +FM   Onset of complaint: today 9am  Pain score: 9/10  There were no vitals filed for this visit.    Lab orders placed from triage:   ua

## 2022-04-11 NOTE — MAU Provider Note (Signed)
History     [id number]  Arrival date and time: 04/11/22 1733    Chief Complaint  Patient presents with   Abdominal Pain   Back Pain     HPI Katelyn White is a 21 y.o. at 60w1dwho presents for back pain. Reports back pain for several weeks that has worsened since this morning. Bilateral low back pain is constant. Also reports constant pelvic pressure. States she's been having braxton hicks but doesn't feel a difference in them today. Previously used support belts & physio tape to help with back pain with no relief. Hasn't taken tylenol or other meds. Denies fever, dysuria, hematuria, vaginal bleeding, or LOF. Reports good fetal movement x 2.    OB History     Gravida  2   Para  1   Term  1   Preterm  0   AB  0   Living  1      SAB  0   IAB  0   Ectopic  0   Multiple  0   Live Births  1           Past Medical History:  Diagnosis Date   Headache    Infection    UTI    Past Surgical History:  Procedure Laterality Date   NO PAST SURGERIES      Family History  Problem Relation Age of Onset   Diabetes Mother    Stroke Father    Hypertension Father     No Known Allergies  No current facility-administered medications on file prior to encounter.   Current Outpatient Medications on File Prior to Encounter  Medication Sig Dispense Refill   insulin glargine (LANTUS) 100 UNIT/ML Solostar Pen Inject 54 Units into the skin daily. (Patient taking differently: Inject 45 Units into the skin daily.) 15 mL 11   NIFEdipine (ADALAT CC) 30 MG 24 hr tablet Take 1 tablet (30 mg total) by mouth daily. 30 tablet 2   Blood Pressure Monitoring (BLOOD PRESSURE KIT) DEVI 1 kit by Does not apply route once a week. Check Blood Pressure regularly and record readings into the Bab[employer] App.  Large Cuff.  DX O90.0 1 each 0   Glucose Blood (BLOOD GLUCOSE TEST STRIPS 333) STRP 1 strip by In Vitro route in the morning, at noon, in the evening, and at bedtime. 100 strip 6    insulin lispro (HUMALOG KWIKPEN) 100 UNIT/ML KwikPen Inject 12 Units into the skin with breakfast, with lunch, and with evening meal. (Patient taking differently: Inject 8 Units into the skin with breakfast, with lunch, and with evening meal.) 15 mL 11   Insulin Pen Needle (PEN NEEDLES) 32G X 5 MM MISC Use one needle with each injection and then discard 90 each 6   Prenatal Vit-Fe Fumarate-FA (PRENATAL MULTIVITAMIN) TABS tablet Take 1 tablet by mouth daily at 12 noon.       ROS Pertinent positives and negative per HPI, all others reviewed and negative  Physical Exam   BP 123/80   Pulse (!) 111   LMP 09/02/2021 (Exact Date)   SpO2 97%   Patient Vitals for the past 24 hrs:  BP Pulse SpO2  04/11/22 1915 123/80 (!) 111 97 %  04/11/22 1900 122/76 (!) 110 97 %  04/11/22 1845 117/66 (!) 110 97 %  04/11/22 1831 110/65 (!) 113 --  04/11/22 1815 127/75 (!) 106 98 %  04/11/22 1800 117/77 (!) 125 98 %  04/11/22 1753 (!) 141/76 (Marland Kitchen  127 --    Physical Exam Vitals and nursing note reviewed. Exam conducted with a chaperone present.  Constitutional:      General: She is not in acute distress.    Appearance: She is well-developed.  HENT:     Head: Normocephalic and atraumatic.  Eyes:     General: No scleral icterus. Pulmonary:     Effort: Pulmonary effort is normal. No respiratory distress.  Skin:    General: Skin is warm and dry.  Neurological:     Mental Status: She is alert.  Psychiatric:        Mood and Affect: Mood normal.        Behavior: Behavior normal.      Cervical Exam Dilation: 3 Effacement (%): 70 Cervical Position: Middle Station: -3 Exam by:: Jorje Guild, NP    Fetal Tracing: Baby A Baseline: 145 Variability: moderate Accelerations: 10x10 Decelerations: none  Baby B Baseline: 145 Variability: moderate Accelerations: 15x15 Decelerations: none  Toco: irregular    Labs Results for orders placed or performed during the hospital encounter of  04/11/22 (from the past 24 hour(s))  Urinalysis, Routine w reflex microscopic Urine, Clean Catch     Status: Abnormal   Collection Time: 04/11/22  5:47 PM  Result Value Ref Range   Color, Urine AMBER (A) YELLOW   APPearance CLOUDY (A) CLEAR   Specific Gravity, Urine 1.019 1.005 - 1.030   pH 5.0 5.0 - 8.0   Glucose, UA NEGATIVE NEGATIVE mg/dL   Hgb urine dipstick NEGATIVE NEGATIVE   Bilirubin Urine NEGATIVE NEGATIVE   Ketones, ur NEGATIVE NEGATIVE mg/dL   Protein, ur 30 (A) NEGATIVE mg/dL   Nitrite NEGATIVE NEGATIVE   Leukocytes,Ua MODERATE (A) NEGATIVE   RBC / HPF 0-5 0 - 5 RBC/hpf   WBC, UA 11-20 0 - 5 WBC/hpf   Bacteria, UA FEW (A) NONE SEEN   Squamous Epithelial / LPF 21-50 0 - 5   Mucus PRESENT     Imaging No results found.  MAU Course  Procedures Lab Orders         Culture, OB Urine         Urinalysis, Routine w reflex microscopic Urine, Clean Catch    Meds ordered this encounter  Medications   acetaminophen (TYLENOL) tablet 1,000 mg   cyclobenzaprine (FLEXERIL) tablet 10 mg   cyclobenzaprine (FLEXERIL) 10 MG tablet    Sig: Take 1 tablet (10 mg total) by mouth 3 (three) times daily as needed for muscle spasms.    Dispense:  30 tablet    Refill:  0    Order Specific Question:   Supervising Provider    Answer:   Donnamae Jude [[id number]]   Imaging Orders  No imaging studies ordered today    MDM Reactive NST x 2 No regular contractions on monitor. Cervix unchanged during visit. Consulted with Dr. Kennon Rounds - ok for discharge if unchanged.   Back pain improved with meds & heat  Intake BP elevated - all repeats normal & asymptomatic Assessment and Plan   1. Back pain affecting pregnancy in third trimester   2. Pelvic pain affecting pregnancy in third trimester, antepartum   3. Premature dilation of cervix during pregnancy, antepartum   4. Monochorionic diamniotic twin gestation in third trimester   5. [redacted] weeks gestation of pregnancy    -Rx flexeril -Tylenol & heat  prn for back pain -Strict preterm labor return precautions  Jorje Guild, NP 04/11/22 8:44 PM

## 2022-04-13 ENCOUNTER — Other Ambulatory Visit: Payer: Self-pay

## 2022-04-13 ENCOUNTER — Inpatient Hospital Stay (HOSPITAL_COMMUNITY)
Admission: AD | Admit: 2022-04-13 | Discharge: 2022-04-13 | Disposition: A | Discharge status: Home / Self Care | Payer: Medicaid Other | Attending: Obstetrics and Gynecology | Admitting: Obstetrics and Gynecology

## 2022-04-13 ENCOUNTER — Encounter (HOSPITAL_COMMUNITY): Payer: Self-pay | Admitting: Obstetrics and Gynecology

## 2022-04-13 DIAGNOSIS — Z3A31 31 weeks gestation of pregnancy: Secondary | ICD-10-CM | POA: Diagnosis not present

## 2022-04-13 DIAGNOSIS — Z0371 Encounter for suspected problem with amniotic cavity and membrane ruled out: Secondary | ICD-10-CM | POA: Diagnosis not present

## 2022-04-13 DIAGNOSIS — O26893 Other specified pregnancy related conditions, third trimester: Secondary | ICD-10-CM | POA: Insufficient documentation

## 2022-04-13 DIAGNOSIS — O30003 Twin pregnancy, unspecified number of placenta and unspecified number of amniotic sacs, third trimester: Secondary | ICD-10-CM

## 2022-04-13 DIAGNOSIS — O365990 Maternal care for other known or suspected poor fetal growth, unspecified trimester, not applicable or unspecified: Secondary | ICD-10-CM

## 2022-04-13 DIAGNOSIS — O26872 Cervical shortening, second trimester: Secondary | ICD-10-CM

## 2022-04-13 DIAGNOSIS — Z3480 Encounter for supervision of other normal pregnancy, unspecified trimester: Secondary | ICD-10-CM

## 2022-04-13 LAB — AMNISURE RUPTURE OF MEMBRANE (ROM) NOT AT ARMC: Amnisure ROM: NEGATIVE

## 2022-04-13 LAB — CULTURE, OB URINE
Culture: 70000 — AB
Special Requests: NORMAL

## 2022-04-13 NOTE — MAU Provider Note (Signed)
History     CSN: [id number]  Arrival date and time: 04/13/22 1510   Event Date/Time   First Provider Initiated Contact with Patient 04/13/22 1540      Chief Complaint  Patient presents with   Contractions   Rupture of Membranes   HPI  Katelyn White is a 21 y.o. G2P1001 at [redacted]w[redacted]d who presents for evaluation of possible rupture of membranes. Patient reports she has been leaking since 1200 today. She reports it is clear and she does not feel like it's urine. She reports intermittent cramping that she rates a 5/10. She has not tried anything for the pain. She has not eaten or drank anything today. She denies any vaginal bleeding. Denies any constipation, diarrhea or any urinary complaints. Reports normal fetal movement of both babies since her arrival to MAU.  OB History     Gravida  2   Para  1   Term  1   Preterm  0   AB  0   Living  1      SAB  0   IAB  0   Ectopic  0   Multiple  0   Live Births  1           Past Medical History:  Diagnosis Date   Headache    Infection    UTI    Past Surgical History:  Procedure Laterality Date   NO PAST SURGERIES      Family History  Problem Relation Age of Onset   Diabetes Mother    Stroke Father    Hypertension Father     Social History   Tobacco Use   Smoking status: Former    Years: 2.00    Types: Cigarettes    Quit date: 06/30/2020    Years since quitting: 1.7   Smokeless tobacco: Never  Vaping Use   Vaping Use: Former  Substance Use Topics   Alcohol use: Not Currently    Comment: weekends   Drug use: No    Allergies: No Known Allergies  Medications Prior to Admission  Medication Sig Dispense Refill Last Dose   Blood Pressure Monitoring (BLOOD PRESSURE KIT) DEVI 1 kit by Does not apply route once a week. Check Blood Pressure regularly and record readings into the Babys[employer] App.  Large Cuff.  DX O90.0 1 each 0    cyclobenzaprine (FLEXERIL) 10 MG tablet Take 1 tablet (10 mg total) by  mouth 3 (three) times daily as needed for muscle spasms. 30 tablet 0    Glucose Blood (BLOOD GLUCOSE TEST STRIPS 333) STRP 1 strip by In Vitro route in the morning, at noon, in the evening, and at bedtime. 100 strip 6    insulin glargine (LANTUS) 100 UNIT/ML Solostar Pen Inject 54 Units into the skin daily. (Patient taking differently: Inject 45 Units into the skin daily.) 15 mL 11    insulin lispro (HUMALOG KWIKPEN) 100 UNIT/ML KwikPen Inject 12 Units into the skin with breakfast, with lunch, and with evening meal. (Patient taking differently: Inject 8 Units into the skin with breakfast, with lunch, and with evening meal.) 15 mL 11    Insulin Pen Needle (PEN NEEDLES) 32G X 5 MM MISC Use one needle with each injection and then discard 90 each 6    NIFEdipine (ADALAT CC) 30 MG 24 hr tablet Take 1 tablet (30 mg total) by mouth daily. 30 tablet 2    Prenatal Vit-Fe Fumarate-FA (PRENATAL MULTIVITAMIN) TABS tablet Take 1 tablet by   mouth daily at 12 noon.       Review of Systems  Constitutional: Negative.  Negative for fatigue and fever.  HENT: Negative.    Respiratory: Negative.  Negative for shortness of breath.   Cardiovascular: Negative.  Negative for chest pain.  Gastrointestinal:  Positive for abdominal pain. Negative for constipation, diarrhea, nausea and vomiting.  Genitourinary:  Positive for vaginal discharge. Negative for dysuria.  Neurological: Negative.  Negative for dizziness and headaches.   Physical Exam   Blood pressure 133/82, pulse (!) 115, temperature 97.8 F (36.6 C), temperature source Oral, resp. rate 16, height 5' 4" (1.626 m), weight 100.3 kg, last menstrual period 09/02/2021, SpO2 98 %, not currently breastfeeding.  Patient Vitals for the past 24 hrs:  BP Temp Temp src Pulse Resp SpO2 Height Weight  04/13/22 1538 133/82 97.8 F (36.6 C) Oral (!) 115 16 98 % -- --  04/13/22 1534 -- -- -- -- -- -- 5' 4" (1.626 m) 100.3 kg    Physical Exam Vitals and nursing note  reviewed.  Constitutional:      General: She is not in acute distress.    Appearance: She is well-developed.  HENT:     Head: Normocephalic.  Eyes:     Pupils: Pupils are equal, round, and reactive to light.  Cardiovascular:     Rate and Rhythm: Normal rate and regular rhythm.     Heart sounds: Normal heart sounds.  Pulmonary:     Effort: Pulmonary effort is normal. No respiratory distress.     Breath sounds: Normal breath sounds.  Abdominal:     General: Bowel sounds are normal. There is no distension.     Palpations: Abdomen is soft.     Tenderness: There is no abdominal tenderness.  Genitourinary:    Comments: Pelvic exam: Cervix pink, visually closed, without lesion, scant white creamy discharge, vaginal walls and external genitalia normal  Skin:    General: Skin is warm and dry.  Neurological:     Mental Status: She is alert and oriented to person, place, and time.  Psychiatric:        Mood and Affect: Mood normal.        Behavior: Behavior normal.        Thought Content: Thought content normal.        Judgment: Judgment normal.     Fetal Tracing:  Baby A  Baseline: 130 Variability: moderate Accels: 15x15 Decels: none  Baby B  Baseline: 145 Variability: moderate Accels: 15x15 Decels: none  Toco: occasional uc's with UI  Cervix: 3/50/-3, unchanged from previous exam  MAU Course  Procedures  Results for orders placed or performed during the hospital encounter of 04/13/22 (from the past 24 hour(s))  Amnisure rupture of membrane (rom)not at [facility]     Status: None   Collection Time: 04/13/22  3:57 PM  Result Value Ref Range   Amnisure ROM NEGATIVE     MDM Labs ordered and reviewed.   UA Amnisure  CNM offered tocolysis and patient declines.  Assessment and Plan   1. Short cervical length during pregnancy in second trimester   2. Supervision of other normal pregnancy, antepartum   3. Maternal care for restricted fetal growth, antepartum      -Discharge home in stable condition -Preterm labor precautions discussed -Patient advised to follow-up with OB as scheduled for prenatal care -Patient may return to MAU as needed or if her condition were to change or worsen  Caroline M Neill, CNM   04/13/2022, 3:40 PM

## 2022-04-13 NOTE — Discharge Instructions (Signed)

## 2022-04-13 NOTE — MAU Note (Signed)
Katelyn White is a 21 y.o. at [redacted]w[redacted]d here in MAU reporting: started leaking fluid this AM around 12. States has continued to leak, states small amounts and does not think it is urine. Fluid is clear. Having contractions every 10 min. No bleeding. DFM.  Onset of complaint: today  Pain score: 6/10  Vitals:   04/13/22 1538  BP: 133/82  Pulse: (!) 115  Resp: 16  Temp: 97.8 F (36.6 C)  SpO2: 98%     FHT: EFM applied in room  Lab orders placed from triage: none

## 2022-04-14 ENCOUNTER — Ambulatory Visit: Payer: Medicaid Other | Admitting: *Deleted

## 2022-04-14 ENCOUNTER — Ambulatory Visit (INDEPENDENT_AMBULATORY_CARE_PROVIDER_SITE_OTHER): Payer: Medicaid Other | Admitting: Obstetrics and Gynecology

## 2022-04-14 ENCOUNTER — Ambulatory Visit: Payer: Medicaid Other | Attending: Maternal & Fetal Medicine

## 2022-04-14 VITALS — BP 122/74 | HR 111

## 2022-04-14 DIAGNOSIS — Z3480 Encounter for supervision of other normal pregnancy, unspecified trimester: Secondary | ICD-10-CM

## 2022-04-14 DIAGNOSIS — O26873 Cervical shortening, third trimester: Secondary | ICD-10-CM

## 2022-04-14 DIAGNOSIS — O26872 Cervical shortening, second trimester: Secondary | ICD-10-CM

## 2022-04-14 DIAGNOSIS — Z3A31 31 weeks gestation of pregnancy: Secondary | ICD-10-CM | POA: Diagnosis not present

## 2022-04-14 DIAGNOSIS — O24414 Gestational diabetes mellitus in pregnancy, insulin controlled: Secondary | ICD-10-CM | POA: Diagnosis not present

## 2022-04-14 DIAGNOSIS — O24419 Gestational diabetes mellitus in pregnancy, unspecified control: Secondary | ICD-10-CM | POA: Diagnosis not present

## 2022-04-14 DIAGNOSIS — O30033 Twin pregnancy, monochorionic/diamniotic, third trimester: Secondary | ICD-10-CM | POA: Insufficient documentation

## 2022-04-14 DIAGNOSIS — Z3402 Encounter for supervision of normal first pregnancy, second trimester: Secondary | ICD-10-CM | POA: Insufficient documentation

## 2022-04-14 DIAGNOSIS — O365932 Maternal care for other known or suspected poor fetal growth, third trimester, fetus 2: Secondary | ICD-10-CM | POA: Insufficient documentation

## 2022-04-14 DIAGNOSIS — O365990 Maternal care for other known or suspected poor fetal growth, unspecified trimester, not applicable or unspecified: Secondary | ICD-10-CM

## 2022-04-15 NOTE — Progress Notes (Signed)
Pt was not seen, appt canceled.

## 2022-04-18 ENCOUNTER — Inpatient Hospital Stay (HOSPITAL_COMMUNITY)
Admission: AD | Admit: 2022-04-18 | Discharge: 2022-04-19 | Disposition: A | Discharge status: Home / Self Care | Payer: Medicaid Other | Attending: Obstetrics & Gynecology | Admitting: Obstetrics & Gynecology

## 2022-04-18 DIAGNOSIS — O30033 Twin pregnancy, monochorionic/diamniotic, third trimester: Secondary | ICD-10-CM | POA: Insufficient documentation

## 2022-04-18 DIAGNOSIS — Z3689 Encounter for other specified antenatal screening: Secondary | ICD-10-CM

## 2022-04-18 DIAGNOSIS — O26872 Cervical shortening, second trimester: Secondary | ICD-10-CM

## 2022-04-18 DIAGNOSIS — M545 Low back pain, unspecified: Secondary | ICD-10-CM | POA: Insufficient documentation

## 2022-04-18 DIAGNOSIS — O4703 False labor before 37 completed weeks of gestation, third trimester: Secondary | ICD-10-CM

## 2022-04-18 DIAGNOSIS — Z3480 Encounter for supervision of other normal pregnancy, unspecified trimester: Secondary | ICD-10-CM

## 2022-04-18 DIAGNOSIS — O365990 Maternal care for other known or suspected poor fetal growth, unspecified trimester, not applicable or unspecified: Secondary | ICD-10-CM

## 2022-04-18 DIAGNOSIS — R102 Pelvic and perineal pain: Secondary | ICD-10-CM | POA: Insufficient documentation

## 2022-04-18 DIAGNOSIS — O26893 Other specified pregnancy related conditions, third trimester: Secondary | ICD-10-CM | POA: Insufficient documentation

## 2022-04-18 DIAGNOSIS — Z3A32 32 weeks gestation of pregnancy: Secondary | ICD-10-CM | POA: Insufficient documentation

## 2022-04-19 ENCOUNTER — Encounter (HOSPITAL_COMMUNITY): Payer: Self-pay | Admitting: Obstetrics & Gynecology

## 2022-04-19 DIAGNOSIS — O26893 Other specified pregnancy related conditions, third trimester: Secondary | ICD-10-CM | POA: Diagnosis present

## 2022-04-19 DIAGNOSIS — O30033 Twin pregnancy, monochorionic/diamniotic, third trimester: Secondary | ICD-10-CM | POA: Diagnosis not present

## 2022-04-19 DIAGNOSIS — Z3A32 32 weeks gestation of pregnancy: Secondary | ICD-10-CM

## 2022-04-19 DIAGNOSIS — Z3689 Encounter for other specified antenatal screening: Secondary | ICD-10-CM

## 2022-04-19 DIAGNOSIS — O4703 False labor before 37 completed weeks of gestation, third trimester: Secondary | ICD-10-CM | POA: Diagnosis not present

## 2022-04-19 DIAGNOSIS — R102 Pelvic and perineal pain: Secondary | ICD-10-CM | POA: Diagnosis not present

## 2022-04-19 DIAGNOSIS — M545 Low back pain, unspecified: Secondary | ICD-10-CM | POA: Diagnosis not present

## 2022-04-19 LAB — URINALYSIS, ROUTINE W REFLEX MICROSCOPIC
Bilirubin Urine: NEGATIVE
Glucose, UA: 50 mg/dL — AB
Hgb urine dipstick: NEGATIVE
Ketones, ur: NEGATIVE mg/dL
Nitrite: NEGATIVE
Protein, ur: NEGATIVE mg/dL
Specific Gravity, Urine: 1.014 (ref 1.005–1.030)
pH: 7 (ref 5.0–8.0)

## 2022-04-19 LAB — WET PREP, GENITAL
Clue Cells Wet Prep HPF POC: NONE SEEN
Sperm: NONE SEEN
Trich, Wet Prep: NONE SEEN
WBC, Wet Prep HPF POC: <10 (ref <10)
Yeast Wet Prep HPF POC: NONE SEEN

## 2022-04-19 MED ORDER — ACETAMINOPHEN 500 MG PO TABS: 1000.0000 mg | ORAL_TABLET | Freq: Once | ORAL | Status: DC

## 2022-04-19 MED ORDER — CYCLOBENZAPRINE HCL 5 MG PO TABS: 10.0000 mg | ORAL_TABLET | Freq: Once | ORAL | Status: DC

## 2022-04-19 NOTE — MAU Note (Addendum)
Pt says she has pressure in pelvis - started 5 pm- worse . Has had all preg  Her lower back hurts - started this am  [facility]- clinic  Did not take anything for pain - doesn't like to take meds

## 2022-04-19 NOTE — MAU Provider Note (Signed)
History     CSN: [id number]  Arrival date and time: 04/18/22 2348   Event Date/Time   First Provider Initiated Contact with Patient 04/19/22 0108      Chief Complaint  Patient presents with   Pelvic Pain   Back Pain   Katelyn White is a 21 y.o. G2P1001 at 31w2dwho [facility]  She reports her next appt is Monday Oct 30th. She presents today for Pelvic Pain and Back Pain.  She states she also has been having contractions, but has been unable to track them.  She states she experiences them every 10 minutes with variation in intensity.  She states it is during this time that the lower back pain starts and only lasts the duration of the contraction. She states the pelvic pain is also caused by the contraction as well as with certain movements.  She rates her pain a 9/10 in the pelvis and 7/10 in her back.  She states she has tried wearing a maternity belt, with no improvement, but used a heating pad with some relief. She endorses fetal movement and denies vaginal concerns, but reports "I had a little bit of mucous plug come out."    OB History     Gravida  2   Para  1   Term  1   Preterm  0   AB  0   Living  1      SAB  0   IAB  0   Ectopic  0   Multiple  0   Live Births  1           Past Medical History:  Diagnosis Date   Headache    Infection    UTI    Past Surgical History:  Procedure Laterality Date   NO PAST SURGERIES      Family History  Problem Relation Age of Onset   Diabetes Mother    Stroke Father    Hypertension Father     Social History   Tobacco Use   Smoking status: Former    Years: 2.00    Types: Cigarettes    Quit date: 06/30/2020    Years since quitting: 1.8   Smokeless tobacco: Never  Vaping Use   Vaping Use: Former  Substance Use Topics   Alcohol use: Not Currently    Comment: weekends   Drug use: No    Allergies: No Known Allergies  Medications Prior to Admission  Medication Sig Dispense Refill  Last Dose   insulin glargine (LANTUS) 100 UNIT/ML Solostar Pen Inject 54 Units into the skin daily. (Patient taking differently: Inject 45 Units into the skin daily.) 15 mL 11 04/18/2022   insulin lispro (HUMALOG KWIKPEN) 100 UNIT/ML KwikPen Inject 12 Units into the skin with breakfast, with lunch, and with evening meal. (Patient taking differently: Inject 8 Units into the skin with breakfast, with lunch, and with evening meal.) 15 mL 11 04/18/2022   Prenatal Vit-Fe Fumarate-FA (PRENATAL MULTIVITAMIN) TABS tablet Take 1 tablet by mouth daily at 12 noon.   04/18/2022   Blood Pressure Monitoring (BLOOD PRESSURE KIT) DEVI 1 kit by Does not apply route once a week. Check Blood Pressure regularly and record readings into the Babyscripts App.  Large Cuff.  DX O90.0 1 each 0    Glucose Blood (BLOOD GLUCOSE TEST STRIPS 333) STRP 1 strip by In Vitro route in the morning, at noon, in the evening, and at bedtime. 100 strip 6  Insulin Pen Needle (PEN NEEDLES) 32G X 5 MM MISC Use one needle with each injection and then discard 90 each 6     Review of Systems  Gastrointestinal:  Positive for abdominal pain (Cramping). Negative for nausea and vomiting.  Genitourinary:  Positive for pelvic pain and vaginal discharge (Mucous). Negative for difficulty urinating, dysuria and vaginal bleeding.  Musculoskeletal:  Positive for back pain.   Physical Exam   Blood pressure 122/82, pulse (!) 107, temperature 97.7 F (36.5 C), temperature source Oral, resp. rate 18, height 5' 4"  (1.626 m), weight 101.7 kg, last menstrual period 09/02/2021, not currently breastfeeding.  Physical Exam Vitals reviewed.  Constitutional:      Appearance: Normal appearance.  HENT:     Head: Normocephalic and atraumatic.  Eyes:     Conjunctiva/sclera: Conjunctivae normal.  Cardiovascular:     Rate and Rhythm: Normal rate.  Pulmonary:     Effort: Pulmonary effort is normal. No respiratory distress.  Abdominal:     Tenderness: There  is no abdominal tenderness.     Comments: Gravid, Appears LGA  Genitourinary:    Comments: No discharge or fluid noted at introitus. Wet prep collected blindly Dilation: 3 Effacement (%): 70 Cervical Position: Anterior Station: -3 Presentation: Vertex Exam by:: Danaisha Celli, CNM  No blood or discharge noted on exam glove.  Musculoskeletal:        General: Normal range of motion.     Cervical back: Normal range of motion.  Skin:    General: Skin is warm and dry.  Neurological:     Mental Status: She is alert and oriented to person, place, and time.  Psychiatric:        Mood and Affect: Mood normal.        Behavior: Behavior normal.     Fetal Assessment 140 bpm, Mod Var, -Decels, +Accels 145 bpm, Mod Var, -Decels, +Accels Toco: Q3-59mn  MAU Course   Results for orders placed or performed during the hospital encounter of 04/18/22 (from the past 24 hour(s))  Urinalysis, Routine w reflex microscopic Urine, Clean Catch     Status: Abnormal   Collection Time: 04/19/22 12:38 AM  Result Value Ref Range   Color, Urine YELLOW YELLOW   APPearance HAZY (A) CLEAR   Specific Gravity, Urine 1.014 1.005 - 1.030   pH 7.0 5.0 - 8.0   Glucose, UA 50 (A) NEGATIVE mg/dL   Hgb urine dipstick NEGATIVE NEGATIVE   Bilirubin Urine NEGATIVE NEGATIVE   Ketones, ur NEGATIVE NEGATIVE mg/dL   Protein, ur NEGATIVE NEGATIVE mg/dL   Nitrite NEGATIVE NEGATIVE   Leukocytes,Ua MODERATE (A) NEGATIVE   RBC / HPF 0-5 0 - 5 RBC/hpf   WBC, UA 0-5 0 - 5 WBC/hpf   Bacteria, UA RARE (A) NONE SEEN   Squamous Epithelial / LPF 6-10 0 - 5   Mucus PRESENT   Wet prep, genital     Status: None   Collection Time: 04/19/22  1:18 AM  Result Value Ref Range   Yeast Wet Prep HPF POC NONE SEEN NONE SEEN   Trich, Wet Prep NONE SEEN NONE SEEN   Clue Cells Wet Prep HPF POC NONE SEEN NONE SEEN   WBC, Wet Prep HPF POC <10 <10   Sperm NONE SEEN    No results found.  MDM PE Labs: UA, Wet Prep EFM Assessment and Plan   21year old G2P1001  TIUP at 32.2 weeks Cat I FT x 2 Pelvic Pain Back Pain  -POC Reviewed. -Exam  performed and findings discussed. -Reassured that cervix remains unchanged. -Wet prep collected. -Discussed how some STIs can cause increased discomfort during pregnancy. -Reviewed usage of pain medication and patient hesitant, but agreeable.    Maryann Conners MSN, CNM 04/19/2022, 1:08 AM   Reassessment (2:40 AM)  -Patient reports some relief despite not receiving medications. -Provider offers repeat cervical exam and patient declines. -Discussed management at home with heat, rest, and hydration. -Instructed to return with any concerns or worsening of symptoms. -Patient without questions. -Discharged to home in stable condition.  Maryann Conners MSN, CNM Advanced Practice Provider, [facility] [employer] [facility]

## 2022-04-21 ENCOUNTER — Ambulatory Visit: Payer: Medicaid Other | Attending: Obstetrics and Gynecology

## 2022-04-21 ENCOUNTER — Other Ambulatory Visit: Payer: Self-pay | Admitting: Obstetrics and Gynecology

## 2022-04-21 ENCOUNTER — Ambulatory Visit: Payer: Medicaid Other | Admitting: *Deleted

## 2022-04-21 VITALS — BP 119/74 | HR 104

## 2022-04-21 DIAGNOSIS — Z3480 Encounter for supervision of other normal pregnancy, unspecified trimester: Secondary | ICD-10-CM | POA: Insufficient documentation

## 2022-04-21 DIAGNOSIS — O30033 Twin pregnancy, monochorionic/diamniotic, third trimester: Secondary | ICD-10-CM | POA: Diagnosis not present

## 2022-04-21 DIAGNOSIS — O24414 Gestational diabetes mellitus in pregnancy, insulin controlled: Secondary | ICD-10-CM | POA: Insufficient documentation

## 2022-04-21 DIAGNOSIS — O365990 Maternal care for other known or suspected poor fetal growth, unspecified trimester, not applicable or unspecified: Secondary | ICD-10-CM | POA: Insufficient documentation

## 2022-04-21 DIAGNOSIS — O26873 Cervical shortening, third trimester: Secondary | ICD-10-CM | POA: Diagnosis not present

## 2022-04-21 DIAGNOSIS — O321XX1 Maternal care for breech presentation, fetus 1: Secondary | ICD-10-CM | POA: Diagnosis not present

## 2022-04-21 DIAGNOSIS — O26872 Cervical shortening, second trimester: Secondary | ICD-10-CM

## 2022-04-21 DIAGNOSIS — Z3A32 32 weeks gestation of pregnancy: Secondary | ICD-10-CM | POA: Insufficient documentation

## 2022-04-21 DIAGNOSIS — O365932 Maternal care for other known or suspected poor fetal growth, third trimester, fetus 2: Secondary | ICD-10-CM | POA: Insufficient documentation

## 2022-04-21 DIAGNOSIS — O321XX2 Maternal care for breech presentation, fetus 2: Secondary | ICD-10-CM | POA: Diagnosis not present

## 2022-04-22 ENCOUNTER — Other Ambulatory Visit: Payer: Self-pay | Admitting: *Deleted

## 2022-04-22 DIAGNOSIS — O24419 Gestational diabetes mellitus in pregnancy, unspecified control: Secondary | ICD-10-CM

## 2022-04-22 DIAGNOSIS — O30033 Twin pregnancy, monochorionic/diamniotic, third trimester: Secondary | ICD-10-CM

## 2022-04-24 ENCOUNTER — Encounter (HOSPITAL_COMMUNITY): Payer: Self-pay | Admitting: Obstetrics and Gynecology

## 2022-04-24 ENCOUNTER — Inpatient Hospital Stay (HOSPITAL_COMMUNITY): Payer: Medicaid Other | Admitting: Anesthesiology

## 2022-04-24 ENCOUNTER — Inpatient Hospital Stay (HOSPITAL_COMMUNITY)
Admission: AD | Admit: 2022-04-24 | Discharge: 2022-04-27 | DRG: 786 | Disposition: A | Discharge status: Home / Self Care | Payer: Medicaid Other | Attending: Obstetrics and Gynecology | Admitting: Obstetrics and Gynecology

## 2022-04-24 ENCOUNTER — Other Ambulatory Visit: Payer: Self-pay

## 2022-04-24 ENCOUNTER — Inpatient Hospital Stay (HOSPITAL_COMMUNITY): Payer: Medicaid Other

## 2022-04-24 ENCOUNTER — Encounter (HOSPITAL_COMMUNITY)
Admission: AD | Disposition: A | Discharge status: Home / Self Care | Payer: Self-pay | Source: Home / Self Care | Attending: Obstetrics and Gynecology

## 2022-04-24 DIAGNOSIS — O9902 Anemia complicating childbirth: Secondary | ICD-10-CM | POA: Diagnosis present

## 2022-04-24 DIAGNOSIS — O365932 Maternal care for other known or suspected poor fetal growth, third trimester, fetus 2: Secondary | ICD-10-CM | POA: Diagnosis present

## 2022-04-24 DIAGNOSIS — O2412 Pre-existing diabetes mellitus, type 2, in childbirth: Secondary | ICD-10-CM | POA: Diagnosis present

## 2022-04-24 DIAGNOSIS — Z87891 Personal history of nicotine dependence: Secondary | ICD-10-CM | POA: Diagnosis not present

## 2022-04-24 DIAGNOSIS — E119 Type 2 diabetes mellitus without complications: Secondary | ICD-10-CM | POA: Diagnosis present

## 2022-04-24 DIAGNOSIS — O24319 Unspecified pre-existing diabetes mellitus in pregnancy, unspecified trimester: Secondary | ICD-10-CM | POA: Diagnosis present

## 2022-04-24 DIAGNOSIS — Z3A33 33 weeks gestation of pregnancy: Secondary | ICD-10-CM

## 2022-04-24 DIAGNOSIS — O321XX1 Maternal care for breech presentation, fetus 1: Secondary | ICD-10-CM

## 2022-04-24 DIAGNOSIS — O30033 Twin pregnancy, monochorionic/diamniotic, third trimester: Secondary | ICD-10-CM

## 2022-04-24 DIAGNOSIS — Z3480 Encounter for supervision of other normal pregnancy, unspecified trimester: Secondary | ICD-10-CM

## 2022-04-24 DIAGNOSIS — O30009 Twin pregnancy, unspecified number of placenta and unspecified number of amniotic sacs, unspecified trimester: Principal | ICD-10-CM

## 2022-04-24 DIAGNOSIS — Z794 Long term (current) use of insulin: Secondary | ICD-10-CM

## 2022-04-24 DIAGNOSIS — O321XX2 Maternal care for breech presentation, fetus 2: Secondary | ICD-10-CM | POA: Diagnosis present

## 2022-04-24 DIAGNOSIS — O24424 Gestational diabetes mellitus in childbirth, insulin controlled: Secondary | ICD-10-CM | POA: Diagnosis not present

## 2022-04-24 DIAGNOSIS — O30039 Twin pregnancy, monochorionic/diamniotic, unspecified trimester: Secondary | ICD-10-CM | POA: Diagnosis present

## 2022-04-24 DIAGNOSIS — O4202 Full-term premature rupture of membranes, onset of labor within 24 hours of rupture: Secondary | ICD-10-CM | POA: Diagnosis not present

## 2022-04-24 DIAGNOSIS — Z98891 History of uterine scar from previous surgery: Secondary | ICD-10-CM

## 2022-04-24 LAB — CBC
HCT: 36.2 % (ref 36.0–46.0)
Hemoglobin: 11.8 g/dL — ABNORMAL LOW (ref 12.0–15.0)
MCH: 25.1 pg — ABNORMAL LOW (ref 26.0–34.0)
MCHC: 32.6 g/dL (ref 30.0–36.0)
MCV: 76.9 fL — ABNORMAL LOW (ref 80.0–100.0)
Platelets: 243 10*3/uL (ref 150–400)
RBC: 4.71 MIL/uL (ref 3.87–5.11)
RDW: 16 % — ABNORMAL HIGH (ref 11.5–15.5)
WBC: 13.2 10*3/uL — ABNORMAL HIGH (ref 4.0–10.5)
nRBC: 0 % (ref 0.0–0.2)

## 2022-04-24 LAB — URINALYSIS, ROUTINE W REFLEX MICROSCOPIC
Bilirubin Urine: NEGATIVE
Glucose, UA: NEGATIVE mg/dL
Hgb urine dipstick: NEGATIVE
Ketones, ur: NEGATIVE mg/dL
Nitrite: NEGATIVE
Protein, ur: NEGATIVE mg/dL
Specific Gravity, Urine: 1.015 (ref 1.005–1.030)
pH: 6 (ref 5.0–8.0)

## 2022-04-24 LAB — COMPREHENSIVE METABOLIC PANEL
ALT: 13 U/L (ref 0–44)
AST: 20 U/L (ref 15–41)
Albumin: 2.6 g/dL — ABNORMAL LOW (ref 3.5–5.0)
Alkaline Phosphatase: 152 U/L — ABNORMAL HIGH (ref 38–126)
Anion gap: 11 (ref 5–15)
BUN: 8 mg/dL (ref 6–20)
CO2: 17 mmol/L — ABNORMAL LOW (ref 22–32)
Calcium: 9 mg/dL (ref 8.9–10.3)
Chloride: 106 mmol/L (ref 98–111)
Creatinine, Ser: 0.43 mg/dL — ABNORMAL LOW (ref 0.44–1.00)
GFR, Estimated: >60 mL/min (ref >60)
Glucose, Bld: 103 mg/dL — ABNORMAL HIGH (ref 70–99)
Potassium: 4 mmol/L (ref 3.5–5.1)
Sodium: 134 mmol/L — ABNORMAL LOW (ref 135–145)
Total Bilirubin: 0.3 mg/dL (ref 0.3–1.2)
Total Protein: 5.9 g/dL — ABNORMAL LOW (ref 6.5–8.1)

## 2022-04-24 LAB — GLUCOSE, CAPILLARY
Glucose-Capillary: 136 mg/dL — ABNORMAL HIGH (ref 70–99)
Glucose-Capillary: 108 mg/dL — ABNORMAL HIGH (ref 70–99)
Glucose-Capillary: 115 mg/dL — ABNORMAL HIGH (ref 70–99)
Glucose-Capillary: 133 mg/dL — ABNORMAL HIGH (ref 70–99)

## 2022-04-24 LAB — TYPE AND SCREEN
ABO/RH(D): O POS
Antibody Screen: NEGATIVE

## 2022-04-24 LAB — LIPASE, BLOOD: Lipase: 29 U/L (ref 11–51)

## 2022-04-24 LAB — AMYLASE: Amylase: 43 U/L (ref 28–100)

## 2022-04-24 SURGERY — Surgical Case
Anesthesia: Spinal | Wound class: Clean Contaminated

## 2022-04-24 MED ORDER — TETANUS-DIPHTH-ACELL PERTUSSIS 5-2.5-18.5 LF-MCG/0.5 IM SUSY: 0.5000 mL | PREFILLED_SYRINGE | Freq: Once | INTRAMUSCULAR | Status: DC

## 2022-04-24 MED ORDER — SCOPOLAMINE 1 MG/3DAYS TD PT72
1.0000 | MEDICATED_PATCH | Freq: Once | TRANSDERMAL | Status: DC
  Filled 2022-04-24: qty 1

## 2022-04-24 MED ORDER — KETOROLAC TROMETHAMINE 30 MG/ML IJ SOLN
30.0000 mg | Freq: Four times a day (QID) | INTRAMUSCULAR | Status: DC
  Administered 2022-04-24: 30 mg via INTRAVENOUS
  Filled 2022-04-24 (×3): qty 1

## 2022-04-24 MED ORDER — PROMETHAZINE HCL 25 MG/ML IJ SOLN: 6.2500 mg | INTRAMUSCULAR | Status: DC | PRN

## 2022-04-24 MED ORDER — DIBUCAINE (PERIANAL) 1 % EX OINT: 1.0000 | TOPICAL_OINTMENT | CUTANEOUS | Status: DC | PRN

## 2022-04-24 MED ORDER — DEXAMETHASONE SODIUM PHOSPHATE 10 MG/ML IJ SOLN
INTRAMUSCULAR | Status: AC
  Filled 2022-04-24: qty 1

## 2022-04-24 MED ORDER — WITCH HAZEL-GLYCERIN EX PADS: 1.0000 | MEDICATED_PAD | CUTANEOUS | Status: DC | PRN

## 2022-04-24 MED ORDER — MEPERIDINE HCL 25 MG/ML IJ SOLN: 6.2500 mg | INTRAMUSCULAR | Status: DC | PRN

## 2022-04-24 MED ORDER — ACETAMINOPHEN 10 MG/ML IV SOLN
INTRAVENOUS | Status: DC | PRN
  Administered 2022-04-24: 1000 mg via INTRAVENOUS

## 2022-04-24 MED ORDER — INFLUENZA VAC SPLIT QUAD 0.5 ML IM SUSY
0.5000 mL | PREFILLED_SYRINGE | INTRAMUSCULAR | Status: DC
  Filled 2022-04-24: qty 0.5

## 2022-04-24 MED ORDER — MENTHOL 3 MG MT LOZG: 1.0000 | LOZENGE | OROMUCOSAL | Status: DC | PRN

## 2022-04-24 MED ORDER — KETOROLAC TROMETHAMINE 30 MG/ML IJ SOLN
INTRAMUSCULAR | Status: AC
  Filled 2022-04-24: qty 1

## 2022-04-24 MED ORDER — MORPHINE SULFATE (PF) 0.5 MG/ML IJ SOLN
INTRAMUSCULAR | Status: AC
  Filled 2022-04-24: qty 10

## 2022-04-24 MED ORDER — NALOXONE HCL 4 MG/10ML IJ SOLN: 1.0000 ug/kg/h | INTRAVENOUS | Status: DC | PRN

## 2022-04-24 MED ORDER — MORPHINE SULFATE (PF) 0.5 MG/ML IJ SOLN
INTRAMUSCULAR | Status: DC | PRN
  Administered 2022-04-24: .15 mg via INTRATHECAL

## 2022-04-24 MED ORDER — OXYTOCIN-SODIUM CHLORIDE 30-0.9 UT/500ML-% IV SOLN
2.5000 [IU]/h | INTRAVENOUS | Status: AC
  Administered 2022-04-24: 2.5 [IU]/h via INTRAVENOUS

## 2022-04-24 MED ORDER — ENOXAPARIN SODIUM 60 MG/0.6ML IJ SOSY
50.0000 mg | PREFILLED_SYRINGE | INTRAMUSCULAR | Status: DC
  Administered 2022-04-25 – 2022-04-26 (×2): 50 mg via SUBCUTANEOUS
  Filled 2022-04-24 (×3): qty 0.6

## 2022-04-24 MED ORDER — ACETAMINOPHEN 500 MG PO TABS
1000.0000 mg | ORAL_TABLET | Freq: Four times a day (QID) | ORAL | Status: AC
  Administered 2022-04-24 – 2022-04-25 (×3): 1000 mg via ORAL
  Filled 2022-04-24 (×4): qty 2

## 2022-04-24 MED ORDER — SCOPOLAMINE 1 MG/3DAYS TD PT72
1.0000 | MEDICATED_PATCH | Freq: Once | TRANSDERMAL | Status: AC
  Administered 2022-04-24: 1.5 mg via TRANSDERMAL

## 2022-04-24 MED ORDER — PHENYLEPHRINE 80 MCG/ML (10ML) SYRINGE FOR IV PUSH (FOR BLOOD PRESSURE SUPPORT)
PREFILLED_SYRINGE | INTRAVENOUS | Status: AC
  Filled 2022-04-24: qty 10

## 2022-04-24 MED ORDER — FAMOTIDINE 20 MG PO TABS
20.0000 mg | ORAL_TABLET | Freq: Once | ORAL | Status: AC
  Administered 2022-04-24: 20 mg via ORAL
  Filled 2022-04-24: qty 1

## 2022-04-24 MED ORDER — COCONUT OIL OIL: 1.0000 | TOPICAL_OIL | Status: DC | PRN

## 2022-04-24 MED ORDER — OXYTOCIN-SODIUM CHLORIDE 30-0.9 UT/500ML-% IV SOLN
INTRAVENOUS | Status: AC
  Filled 2022-04-24: qty 500

## 2022-04-24 MED ORDER — FENTANYL CITRATE (PF) 100 MCG/2ML IJ SOLN
INTRAMUSCULAR | Status: DC | PRN
  Administered 2022-04-24: 15 ug via INTRATHECAL

## 2022-04-24 MED ORDER — ONDANSETRON HCL 4 MG/2ML IJ SOLN
INTRAMUSCULAR | Status: DC | PRN
  Administered 2022-04-24: 4 mg via INTRAVENOUS

## 2022-04-24 MED ORDER — PHENYLEPHRINE 80 MCG/ML (10ML) SYRINGE FOR IV PUSH (FOR BLOOD PRESSURE SUPPORT)
PREFILLED_SYRINGE | INTRAVENOUS | Status: DC | PRN
  Administered 2022-04-24: 160 ug via INTRAVENOUS

## 2022-04-24 MED ORDER — SENNOSIDES-DOCUSATE SODIUM 8.6-50 MG PO TABS
2.0000 | ORAL_TABLET | Freq: Every day | ORAL | Status: DC
  Administered 2022-04-25 – 2022-04-26 (×2): 2 via ORAL
  Filled 2022-04-24 (×2): qty 2

## 2022-04-24 MED ORDER — ONDANSETRON HCL 4 MG/2ML IJ SOLN
INTRAMUSCULAR | Status: AC
  Filled 2022-04-24: qty 2

## 2022-04-24 MED ORDER — INSULIN GLARGINE-YFGN 100 UNIT/ML ~~LOC~~ SOLN
20.0000 [IU] | Freq: Every day | SUBCUTANEOUS | Status: DC
  Administered 2022-04-24: 20 [IU] via SUBCUTANEOUS
  Filled 2022-04-24 (×4): qty 0.2

## 2022-04-24 MED ORDER — OXYTOCIN-SODIUM CHLORIDE 30-0.9 UT/500ML-% IV SOLN
INTRAVENOUS | Status: DC | PRN
  Administered 2022-04-24: 300 mL via INTRAVENOUS

## 2022-04-24 MED ORDER — LACTATED RINGERS IV BOLUS
1000.0000 mL | Freq: Once | INTRAVENOUS | Status: AC
  Administered 2022-04-24: 1000 mL via INTRAVENOUS

## 2022-04-24 MED ORDER — INSULIN ASPART 100 UNIT/ML IJ SOLN: 0.0000 [IU] | Freq: Three times a day (TID) | INTRAMUSCULAR | Status: DC

## 2022-04-24 MED ORDER — NIFEDIPINE 10 MG PO CAPS
10.0000 mg | ORAL_CAPSULE | ORAL | Status: AC | PRN
  Administered 2022-04-24 (×4): 10 mg via ORAL
  Filled 2022-04-24 (×4): qty 1

## 2022-04-24 MED ORDER — ACETAMINOPHEN 10 MG/ML IV SOLN
INTRAVENOUS | Status: AC
  Filled 2022-04-24: qty 100

## 2022-04-24 MED ORDER — DIPHENHYDRAMINE HCL 50 MG/ML IJ SOLN: 12.5000 mg | INTRAMUSCULAR | Status: DC | PRN

## 2022-04-24 MED ORDER — KETOROLAC TROMETHAMINE 30 MG/ML IJ SOLN: 30.0000 mg | Freq: Four times a day (QID) | INTRAMUSCULAR | Status: DC | PRN

## 2022-04-24 MED ORDER — ONDANSETRON HCL 4 MG/2ML IJ SOLN: 4.0000 mg | Freq: Three times a day (TID) | INTRAMUSCULAR | Status: DC | PRN

## 2022-04-24 MED ORDER — SIMETHICONE 80 MG PO CHEW: 80.0000 mg | CHEWABLE_TABLET | ORAL | Status: DC | PRN

## 2022-04-24 MED ORDER — DIPHENHYDRAMINE HCL 25 MG PO CAPS: 25.0000 mg | ORAL_CAPSULE | Freq: Four times a day (QID) | ORAL | Status: DC | PRN

## 2022-04-24 MED ORDER — PRENATAL MULTIVITAMIN CH
1.0000 | ORAL_TABLET | Freq: Every day | ORAL | Status: DC
  Administered 2022-04-25 – 2022-04-26 (×2): 1 via ORAL
  Filled 2022-04-24 (×2): qty 1

## 2022-04-24 MED ORDER — SIMETHICONE 80 MG PO CHEW
80.0000 mg | CHEWABLE_TABLET | Freq: Three times a day (TID) | ORAL | Status: DC
  Administered 2022-04-24 – 2022-04-26 (×7): 80 mg via ORAL
  Filled 2022-04-24 (×7): qty 1

## 2022-04-24 MED ORDER — FENTANYL CITRATE (PF) 100 MCG/2ML IJ SOLN
INTRAMUSCULAR | Status: AC
  Filled 2022-04-24: qty 2

## 2022-04-24 MED ORDER — NALOXONE HCL 0.4 MG/ML IJ SOLN: 0.4000 mg | INTRAMUSCULAR | Status: DC | PRN

## 2022-04-24 MED ORDER — SOD CITRATE-CITRIC ACID 500-334 MG/5ML PO SOLN
30.0000 mL | Freq: Once | ORAL | Status: AC
  Administered 2022-04-24: 30 mL via ORAL
  Filled 2022-04-24: qty 30

## 2022-04-24 MED ORDER — FENTANYL CITRATE (PF) 100 MCG/2ML IJ SOLN: 25.0000 ug | INTRAMUSCULAR | Status: DC | PRN

## 2022-04-24 MED ORDER — FUROSEMIDE 10 MG/ML IJ SOLN
INTRAMUSCULAR | Status: AC
  Filled 2022-04-24: qty 2

## 2022-04-24 MED ORDER — FUROSEMIDE 10 MG/ML IJ SOLN: INTRAMUSCULAR | Status: DC | PRN

## 2022-04-24 MED ORDER — BUPIVACAINE IN DEXTROSE 0.75-8.25 % IT SOLN
INTRATHECAL | Status: DC | PRN
  Administered 2022-04-24: 1.6 mg via INTRATHECAL

## 2022-04-24 MED ORDER — DEXAMETHASONE SODIUM PHOSPHATE 10 MG/ML IJ SOLN
INTRAMUSCULAR | Status: DC | PRN
  Administered 2022-04-24: 8 mg via INTRAVENOUS

## 2022-04-24 MED ORDER — PHENYLEPHRINE HCL-NACL 20-0.9 MG/250ML-% IV SOLN
INTRAVENOUS | Status: DC | PRN
  Administered 2022-04-24: 60 ug/min via INTRAVENOUS

## 2022-04-24 MED ORDER — SODIUM CHLORIDE 0.9% FLUSH: 3.0000 mL | INTRAVENOUS | Status: DC | PRN

## 2022-04-24 MED ORDER — IBUPROFEN 600 MG PO TABS: 600.0000 mg | ORAL_TABLET | Freq: Four times a day (QID) | ORAL | Status: DC

## 2022-04-24 MED ORDER — GABAPENTIN 100 MG PO CAPS
100.0000 mg | ORAL_CAPSULE | Freq: Two times a day (BID) | ORAL | Status: DC
  Administered 2022-04-24 – 2022-04-26 (×5): 100 mg via ORAL
  Filled 2022-04-24 (×5): qty 1

## 2022-04-24 MED ORDER — DIPHENHYDRAMINE HCL 25 MG PO CAPS: 25.0000 mg | ORAL_CAPSULE | ORAL | Status: DC | PRN

## 2022-04-24 MED ORDER — CEFAZOLIN SODIUM-DEXTROSE 2-4 GM/100ML-% IV SOLN
2.0000 g | INTRAVENOUS | Status: AC
  Administered 2022-04-24: 2 g via INTRAVENOUS
  Filled 2022-04-24: qty 100

## 2022-04-24 MED ORDER — ZOLPIDEM TARTRATE 5 MG PO TABS: 5.0000 mg | ORAL_TABLET | Freq: Every evening | ORAL | Status: DC | PRN

## 2022-04-24 MED ORDER — PHENYLEPHRINE HCL-NACL 20-0.9 MG/250ML-% IV SOLN
INTRAVENOUS | Status: AC
  Filled 2022-04-24: qty 250

## 2022-04-24 MED ORDER — LACTATED RINGERS IV SOLN: INTRAVENOUS | Status: DC

## 2022-04-24 MED ORDER — KETOROLAC TROMETHAMINE 30 MG/ML IJ SOLN
30.0000 mg | Freq: Four times a day (QID) | INTRAMUSCULAR | Status: DC | PRN
  Administered 2022-04-24: 30 mg via INTRAVENOUS

## 2022-04-24 SURGICAL SUPPLY — 37 items
BENZOIN TINCTURE PRP APPL 2/3 (GAUZE/BANDAGES/DRESSINGS) ×1 IMPLANT
CHLORAPREP W/TINT 26ML (MISCELLANEOUS) ×2 IMPLANT
CLAMP UMBILICAL CORD (MISCELLANEOUS) ×1 IMPLANT
CLOTH BEACON ORANGE TIMEOUT ST (SAFETY) ×1 IMPLANT
DRAPE C SECTION CLR SCREEN (DRAPES) IMPLANT
DRSG OPSITE POSTOP 4X10 (GAUZE/BANDAGES/DRESSINGS) ×1 IMPLANT
ELECT REM PT RETURN 9FT ADLT (ELECTROSURGICAL) ×1
ELECTRODE REM PT RTRN 9FT ADLT (ELECTROSURGICAL) ×1 IMPLANT
EXTRACTOR VACUUM M CUP 4 TUBE (SUCTIONS) IMPLANT
GLOVE BIO SURGEON STRL SZ7.5 (GLOVE) ×1 IMPLANT
GLOVE BIOGEL PI IND STRL 7.0 (GLOVE) ×2 IMPLANT
GOWN STRL REUS W/TWL 2XL LVL3 (GOWN DISPOSABLE) ×1 IMPLANT
GOWN STRL REUS W/TWL LRG LVL3 (GOWN DISPOSABLE) ×2 IMPLANT
KIT ABG SYR 3ML LUER SLIP (SYRINGE) IMPLANT
NDL HYPO 25X5/8 SAFETYGLIDE (NEEDLE) IMPLANT
NEEDLE HYPO 22GX1.5 SAFETY (NEEDLE) ×1 IMPLANT
NEEDLE HYPO 25X5/8 SAFETYGLIDE (NEEDLE) IMPLANT
NS IRRIG 1000ML POUR BTL (IV SOLUTION) ×1 IMPLANT
PACK C SECTION WH (CUSTOM PROCEDURE TRAY) ×1 IMPLANT
PAD OB MATERNITY 4.3X12.25 (PERSONAL CARE ITEMS) ×1 IMPLANT
RTRCTR C-SECT PINK 25CM LRG (MISCELLANEOUS) ×1 IMPLANT
STRIP CLOSURE SKIN 1/2X4 (GAUZE/BANDAGES/DRESSINGS) ×1 IMPLANT
SUT CHROMIC 1 CTX 36 (SUTURE) ×2 IMPLANT
SUT PLAIN 0 NONE (SUTURE) IMPLANT
SUT VIC AB 1 CT1 36 (SUTURE) ×1 IMPLANT
SUT VIC AB 2-0 CT1 (SUTURE) ×1 IMPLANT
SUT VIC AB 2-0 CT1 27 (SUTURE) ×1
SUT VIC AB 2-0 CT1 TAPERPNT 27 (SUTURE) ×1 IMPLANT
SUT VIC AB 3-0 CT1 27 (SUTURE) ×1
SUT VIC AB 3-0 CT1 TAPERPNT 27 (SUTURE) ×1 IMPLANT
SUT VIC AB 3-0 SH 27 (SUTURE)
SUT VIC AB 3-0 SH 27X BRD (SUTURE) IMPLANT
SUT VIC AB 4-0 KS 27 (SUTURE) ×1 IMPLANT
SYR BULB IRRIGATION 50ML (SYRINGE) IMPLANT
TOWEL OR 17X24 6PK STRL BLUE (TOWEL DISPOSABLE) ×1 IMPLANT
TRAY FOLEY W/BAG SLVR 14FR LF (SET/KITS/TRAYS/PACK) ×1 IMPLANT
WATER STERILE IRR 1000ML POUR (IV SOLUTION) ×1 IMPLANT

## 2022-04-24 NOTE — Anesthesia Preprocedure Evaluation (Addendum)
Anesthesia Evaluation  Patient identified by MRN, date of birth, ID band Patient awake    Reviewed: Allergy & Precautions, NPO status , Patient's Chart, lab work & pertinent test results  Airway Mallampati: II  TM Distance: >3 FB Neck ROM: Full    Dental  (+) Teeth Intact, Dental Advisory Given   Pulmonary former smoker   Pulmonary exam normal breath sounds clear to auscultation       Cardiovascular negative cardio ROS   Rhythm:Regular Rate:Tachycardia     Neuro/Psych  Headaches    GI/Hepatic negative GI ROS, Neg liver ROS,,,  Endo/Other  diabetes, Insulin Dependent    Renal/GU negative Renal ROS     Musculoskeletal negative musculoskeletal ROS (+)    Abdominal   Peds  Hematology  (+) Blood dyscrasia, anemia Plt 243k   Anesthesia Other Findings Day of surgery medications reviewed with the patient.  Reproductive/Obstetrics                            Anesthesia Physical Anesthesia Plan  ASA: 3 and emergent  Anesthesia Plan: Spinal   Post-op Pain Management:    Induction: Intravenous  PONV Risk Score and Plan: 2 and Dexamethasone and Ondansetron  Airway Management Planned: Natural Airway  Additional Equipment:   Intra-op Plan:   Post-operative Plan:   Informed Consent: I have reviewed the patients History and Physical, chart, labs and discussed the procedure including the risks, benefits and alternatives for the proposed anesthesia with the patient or authorized representative who has indicated his/her understanding and acceptance.     Dental advisory given  Plan Discussed with: CRNA, Anesthesiologist and Surgeon  Anesthesia Plan Comments: (Discussed risks and benefits of and differences between spinal and general. Discussed risks of spinal including headache, backache, failure, bleeding, infection, and nerve damage. Patient consents to spinal. Questions answered.  Coagulation studies and platelet count acceptable.)       Anesthesia Quick Evaluation

## 2022-04-24 NOTE — MAU Note (Signed)
.  Katelyn White is a 21 y.o. at [redacted]w[redacted]d here in MAU reporting: reports "strong pelvic pain and pressure"  that started at 0000. Rates pain at 10/10. Pt also reports having contractions that come and go. No LOF or bleeding. +Fm.   FHT: Baby A: 141, Baby B 144 Lab orders placed from triage:   UA. BP 128/78 Pulse 108, O2:100

## 2022-04-24 NOTE — H&P (Addendum)
OBSTETRIC ADMISSION HISTORY AND PHYSICAL  Katelyn White is a 21 y.o. female G2P1001 with IUP at 13w0dby 7w [place] to MAU for PTL. She reports +FMs, No LOF, no VB, no blurry vision, headaches or peripheral edema, and RUQ pain.  She plans on breast feeding. She request none for birth control. She [facility]   Dating: By 7 wk [place]--->  Estimated Date of Delivery: 06/12/22  Sono:    @[redacted]w[redacted]d , CWD, normal anatomy, breech breech presentation x2, anterior placental lie, 1876g, 23% EFW (A), 2055g 40%EFW (B)  Prenatal History/Complications:  -Mono/di twins -T2DM (Lantus 54 units, humalog 12 U TID) -FGR Twin B    Past Medical History: Past Medical History:  Diagnosis Date   Headache    Infection    UTI    Past Surgical History: Past Surgical History:  Procedure Laterality Date   NO PAST SURGERIES      Obstetrical History: OB History     Gravida  2   Para  1   Term  1   Preterm  0   AB  0   Living  1      SAB  0   IAB  0   Ectopic  0   Multiple  0   Live Births  1           Social History Social History   Socioeconomic History   Marital status: Single    Spouse name: Not on file   Number of children: Not on file   Years of education: Not on file   Highest education level: Not on file  Occupational History   Not on file  Tobacco Use   Smoking status: Former    Years: 2.00    Types: Cigarettes    Quit date: 06/30/2020    Years since quitting: 1.8   Smokeless tobacco: Never  Vaping Use   Vaping Use: Former  Substance and Sexual Activity   Alcohol use: Not Currently    Comment: weekends   Drug use: No   Sexual activity: Yes    Birth control/protection: None  Other Topics Concern   Not on file  Social History Narrative   Not on file   Social Determinants of Health   Financial Resource Strain: Not on file  Food Insecurity: No Food Insecurity (03/31/2022)   Hunger Vital Sign    Worried About Running  Out of Food in the Last Year: Never true    Ran Out of Food in the Last Year: Never true  Transportation Needs: No Transportation Needs (03/31/2022)   PRAPARE - THydrologist(Medical): No    Lack of Transportation (Non-Medical): No  Physical Activity: Not on file  Stress: Not on file  Social Connections: Not on file    Family History: Family History  Problem Relation Age of Onset   Diabetes Mother    Stroke Father    Hypertension Father     Allergies: No Known Allergies  Medications Prior to Admission  Medication Sig Dispense Refill Last Dose   insulin glargine (LANTUS) 100 UNIT/ML Solostar Pen Inject 54 Units into the skin daily. (Patient taking differently: Inject 45 Units into the skin daily.) 15 mL 11 Past Week   insulin lispro (HUMALOG KWIKPEN) 100 UNIT/ML KwikPen Inject 12 Units into the skin with breakfast, with lunch, and with evening meal. (Patient taking differently: Inject 8 Units into the skin with breakfast, with  lunch, and with evening meal.) 15 mL 11 Past Week   Insulin Pen Needle (PEN NEEDLES) 32G X 5 MM MISC Use one needle with each injection and then discard 90 each 6 Past Week   Prenatal Vit-Fe Fumarate-FA (PRENATAL MULTIVITAMIN) TABS tablet Take 1 tablet by mouth daily at 12 noon.   04/23/2022   Blood Pressure Monitoring (BLOOD PRESSURE KIT) DEVI 1 kit by Does not apply route once a week. Check Blood Pressure regularly and record readings into the Babyscripts App.  Large Cuff.  DX O90.0 1 each 0    Glucose Blood (BLOOD GLUCOSE TEST STRIPS 333) STRP 1 strip by In Vitro route in the morning, at noon, in the evening, and at bedtime. 100 strip 6      Review of Systems   All systems reviewed and negative except as stated in HPI  Blood pressure 122/70, pulse 96, temperature 98 F (36.7 C), temperature source Oral, resp. rate 16, height 5' 4"  (1.626 m), weight 103.4 kg, last menstrual period 09/02/2021, SpO2 98 %, not currently  breastfeeding. General appearance: alert and mild distress Lungs: normal effort Heart: regular rate noted Abdomen: soft, non-tender; gravid Pelvic: See below.  Extremities: Homans sign is negative, no sign of DVT Presentation: breech and breech Fetal monitoringBaseline: 140 bpm, Variability: Good {> 6 bpm), Accelerations: Reactive, and Decelerations: Absent Uterine activity having contractions Dilation: 4 Effacement (%): 80 Station: Ballotable Exam by:: Carmelia Roller CNM   Prenatal labs: ABO, Rh: --/--/O POS (09/11 1546) Antibody: NEG (09/11 1546) Rubella:   RPR: Non Reactive (10/02 0934)  HBsAg: Negative (08/08 0000)  HIV: Non Reactive (10/02 0934)  GBS:    1 hr Glucola pre-existing Genetic screening  unknown Anatomy US FGR in Twin B  Prenatal Transfer Tool  Maternal Diabetes: Yes:  Diabetes Type:  Pre-pregnancy, Insulin/Medication controlled Genetic Screening: unk Maternal Ultrasounds/Referrals: IUGR Fetal Ultrasounds or other Referrals:  Fetal echo, Referred to Materal Fetal Medicine  Maternal Substance Abuse:  No Significant Maternal Medications:  None Significant Maternal Lab Results:  None Number of Prenatal Visits:greater than 3 verified prenatal visits Other Comments:  None  Results for orders placed or performed during the hospital encounter of 04/24/22 (from the past 24 hour(s))  Urinalysis, Routine w reflex microscopic   Collection Time: 04/24/22  3:10 AM  Result Value Ref Range   Color, Urine YELLOW YELLOW   APPearance HAZY (A) CLEAR   Specific Gravity, Urine 1.015 1.005 - 1.030   pH 6.0 5.0 - 8.0   Glucose, UA NEGATIVE NEGATIVE mg/dL   Hgb urine dipstick NEGATIVE NEGATIVE   Bilirubin Urine NEGATIVE NEGATIVE   Ketones, ur NEGATIVE NEGATIVE mg/dL   Protein, ur NEGATIVE NEGATIVE mg/dL   Nitrite NEGATIVE NEGATIVE   Leukocytes,Ua LARGE (A) NEGATIVE   RBC / HPF 0-5 0 - 5 RBC/hpf   WBC, UA 21-50 0 - 5 WBC/hpf   Bacteria, UA FEW (A) NONE SEEN   Squamous  Epithelial / LPF 6-10 0 - 5   Mucus PRESENT   CBC   Collection Time: 04/24/22  5:48 AM  Result Value Ref Range   WBC 13.2 (H) 4.0 - 10.5 K/uL   RBC 4.71 3.87 - 5.11 MIL/uL   Hemoglobin 11.8 (L) 12.0 - 15.0 g/dL   HCT 36.2 36.0 - 46.0 %   MCV 76.9 (L) 80.0 - 100.0 fL   MCH 25.1 (L) 26.0 - 34.0 pg   MCHC 32.6 30.0 - 36.0 g/dL   RDW 16.0 (H) 11.5 - 15.5 %  Platelets 243 150 - 400 K/uL   nRBC 0.0 0.0 - 0.2 %  Comprehensive metabolic panel   Collection Time: 04/24/22  5:48 AM  Result Value Ref Range   Sodium 134 (L) 135 - 145 mmol/L   Potassium 4.0 3.5 - 5.1 mmol/L   Chloride 106 98 - 111 mmol/L   CO2 17 (L) 22 - 32 mmol/L   Glucose, Bld 103 (H) 70 - 99 mg/dL   BUN 8 6 - 20 mg/dL   Creatinine, Ser 0.43 (L) 0.44 - 1.00 mg/dL   Calcium 9.0 8.9 - 10.3 mg/dL   Total Protein 5.9 (L) 6.5 - 8.1 g/dL   Albumin 2.6 (L) 3.5 - 5.0 g/dL   AST 20 15 - 41 U/L   ALT 13 0 - 44 U/L   Alkaline Phosphatase 152 (H) 38 - 126 U/L   Total Bilirubin 0.3 0.3 - 1.2 mg/dL   GFR, Estimated >60 >60 mL/min   Anion gap 11 5 - 15  Amylase   Collection Time: 04/24/22  5:48 AM  Result Value Ref Range   Amylase 43 28 - 100 U/L  Lipase, blood   Collection Time: 04/24/22  5:48 AM  Result Value Ref Range   Lipase 29 11 - 51 U/L    Patient Active Problem List   Diagnosis Date Noted   Encounter for prenatal care in second trimester of first pregnancy 04/14/2022   Short cervical length during pregnancy in second trimester 03/11/2022   Maternal care for restricted fetal growth, antepartum 03/11/2022   Monochorionic diamniotic twin gestation in second trimester 03/10/2022   Pre-existing diabetes mellitus in pregnancy 02/21/2022   Monochorionic diamniotic twin gestation 02/21/2022   Supervision of other normal pregnancy, antepartum 11/01/2021    Assessment/Plan:  Katelyn White is a 21 y.o. G2P1001 at 79w0dhere for PTL and pLTCS 2/2 breech mono/di twins.   pre-existing diabetic-on insulin- CBG  136 FETAL ECHO-nl BMZ 9/11 & 9/12  The risks of surgery were discussed with the patient including but were not limited to: bleeding which may require transfusion or reoperation; infection which may require antibiotics; injury to bowel, bladder, ureters or other surrounding organs; injury to the fetus; need for additional procedures including hysterectomy in the event of a life-threatening hemorrhage; formation of adhesions; placental abnormalities wth subsequent pregnancies; incisional problems; thromboembolic phenomenon and other postoperative/anesthesia complications.   The patient concurred with the proposed plan, giving informed written consent for the procedures.  Antibiotics ordered.  To OR when ready.   NICU team notified.    SR[place] DO  04/24/2022, 8:52 AM

## 2022-04-24 NOTE — Lactation Note (Signed)
This note was copied from a baby's chart. Lactation Consultation Note Pump set up by RN. LC to f/u in the morning for initial consult.  Patient Name: Katelyn White [id number] Date: 04/24/2022   Age:23 hours   [name] 04/24/2022, 4:15 PM

## 2022-04-24 NOTE — MAU Provider Note (Addendum)
Chief Complaint:  Contractions and Pelvic Pain   Event Date/Time   First Provider Initiated Contact with Patient 04/24/22 0335      HPI: Katelyn White is a 21 y.o. G2P1001 at 31w0dho presents to maternity admissions reporting strong contractions and pelvic pressure.  Has a recent history of PTL with cervical change.  Last 2 exams were 3/70%.. She reports good fetal movement, denies LOF, vaginal bleeding, vaginal itching/burning, urinary symptoms, h/a, dizziness, n/v, diarrhea, constipation or fever/chills.   Pelvic Pain The patient's primary symptoms include pelvic pain. The patient's pertinent negatives include no genital itching or vaginal bleeding. This is a recurrent problem. The current episode started today. The problem affects both sides. She is pregnant. Associated symptoms include abdominal pain and nausea. Pertinent negatives include no chills, dysuria or fever. Nothing aggravates the symptoms. She has tried nothing for the symptoms.   RN Note: Katelyn White a 21y.o. at 323w0dere in MAU reporting: reports "strong pelvic pain and pressure"  that started at 0000. Rates pain at 10/10. Pt also reports having contractions that come and go. No LOF or bleeding. +Fm.   Past Medical History: Past Medical History:  Diagnosis Date   Headache    Infection    UTI    Past obstetric history: OB History  Gravida Para Term Preterm AB Living  2 1 1  0 0 1  SAB IAB Ectopic Multiple Live Births  0 0 0 0 1    # Outcome Date GA Lbr Len/2nd Weight Sex Delivery Anes PTL Lv  2 Current           1 Term 04/09/21 [id number] 3980 g M Vag-Spont EPI  LIV    Past Surgical History: Past Surgical History:  Procedure Laterality Date   NO PAST SURGERIES      Family History: Family History  Problem Relation Age of Onset   Diabetes Mother    Stroke Father    Hypertension Father     Social History: Social History   Tobacco Use   Smoking status: Former    Years: 2.00    Types:  Cigarettes    Quit date: 06/30/2020    Years since quitting: 1.8   Smokeless tobacco: Never  Vaping Use   Vaping Use: Former  Substance Use Topics   Alcohol use: Not Currently    Comment: weekends   Drug use: No    Allergies: No Known Allergies  Meds:  Medications Prior to Admission  Medication Sig Dispense Refill Last Dose   insulin glargine (LANTUS) 100 UNIT/ML Solostar Pen Inject 54 Units into the skin daily. (Patient taking differently: Inject 45 Units into the skin daily.) 15 mL 11 Past Week   insulin lispro (HUMALOG KWIKPEN) 100 UNIT/ML KwikPen Inject 12 Units into the skin with breakfast, with lunch, and with evening meal. (Patient taking differently: Inject 8 Units into the skin with breakfast, with lunch, and with evening meal.) 15 mL 11 Past Week   Insulin Pen Needle (PEN NEEDLES) 32G X 5 MM MISC Use one needle with each injection and then discard 90 each 6 Past Week   Prenatal Vit-Fe Fumarate-FA (PRENATAL MULTIVITAMIN) TABS tablet Take 1 tablet by mouth daily at 12 noon.   04/23/2022   Blood Pressure Monitoring (BLOOD PRESSURE KIT) DEVI 1 kit by Does not apply route once a week. Check Blood Pressure regularly and record readings into the Babyscripts App.  Large Cuff.  DX O90.0 1 each 0  Glucose Blood (BLOOD GLUCOSE TEST STRIPS 333) STRP 1 strip by In Vitro route in the morning, at noon, in the evening, and at bedtime. 100 strip 6     I have reviewed patient's Past Medical Hx, Surgical Hx, Family Hx, Social Hx, medications and allergies.   ROS:  Review of Systems  Constitutional:  Negative for chills and fever.  Gastrointestinal:  Positive for abdominal pain and nausea.  Genitourinary:  Positive for pelvic pain. Negative for dysuria.   Other systems negative  Physical Exam  Patient Vitals for the past 24 hrs:  BP Temp Temp src Pulse Resp SpO2 Height Weight  04/24/22 0315 128/78 98.1 F (36.7 C) Oral (!) 109 18 99 % 5' 4"  (1.626 m) 103.4 kg   Constitutional:  Well-developed, well-nourished female in no acute distress.  Cardiovascular: normal rate and rhythm Respiratory: normal effort, clear to auscultation bilaterally GI: Abd soft, non-tender, gravid appropriate for gestational age.   No rebound or guarding. MS: Extremities nontender, no edema, normal ROM Neurologic: Alert and oriented x 4.  GU: Neg CVAT.  PELVIC EXAM:  Dilation: 3.5 Effacement (%): 80 Station: -3 Exam by:: Hansel Feinstein, CNM.  FHT:  Baseline 130s both twins , moderate variability, accelerations present, no decelerations    Both twins reactive Contractions: q 2-3 mins Irregular     Labs:  --/--/O POS (09/11 1546)  Imaging:    MAU Course/MDM: I have reviewed the triage vital signs and the nursing notes.   Pertinent labs & imaging results that were available during my care of the patient were reviewed by me and considered in my medical decision making (see chart for details).      I have reviewed her medical records including past results, notes and treatments.   I have ordered labs and reviewed results.  NST reviewed, reactive Consult Dr Damita Dunnings with presentation, exam findings and test results.  Treatments in MAU included Procardia series .   0359 Procardia given with reported and observed cessation of contractions Patient observed after that to make sure  0500   Noted uterine irritability and intermittent contractions returning.  Instructed Rn to resume Procardia series  0530: Sudden onset of RUQ pain. Tender with compression of ribs in that area but also RUQ.  States has new Nausea and vomiting   Will check for Gallbladder issues  0700 Continues to contract.  Recheck of cervix:  Dilation: 4 Effacement (%): 80 Station: Ballotable Exam by:: Carmelia Roller CNM Consulted Dr Damita Dunnings, continue obs, start IVF and reevaluate in an hour or so  Care turned over to [place]: Twin pregnancy at 39w0dPreterm uterine contractions RUQ pain with  nausea and vomiting, negative cholecystitis  Plan: Continue to observe   MHansel FeinsteinCNM, MSN Certified Nurse-Midwife 04/24/2022 3:35 AM   A/P:  --Care of patient assumed at 0800 --[place]villeat bedside at 0830 --Cervix 4/90/-1 with palpable BBOW per my exam --Patient reporting no change in pain score since MAU arrival --Patient visibly wincing during contractions --Twins breech/breech on last assessment --Concern for labor discussed with Dr. AHarolyn Rutherford--Dr. SNaaman PlummerAutry-Lott at bedside to discuss operative delivery  SMallie Snooks MWinona MSN, CNM Certified Nurse Midwife, FWhite River Medical Centerfor [facility]De[facility] [place]lm[place]

## 2022-04-24 NOTE — Op Note (Signed)
Katelyn White PROCEDURE DATE: 04/24/2022  PREOPERATIVE DIAGNOSES: Intrauterine pregnancy at [redacted]w[redacted]d weeks gestation;  breech-breech presentation, monochroionic diamniotic twin gestation, PTL  POSTOPERATIVE DIAGNOSES: The same  PROCEDURE: Low Transverse Cesarean Section  SURGEON:  Dr. Rip Harbour  ASSISTANT:  Dr. Janus Molder. An experienced assistant was required given the standard of surgical care given the complexity of the case.  This assistant was needed for exposure, dissection, suctioning, retraction, instrument exchange, assisting with delivery with administration of fundal pressure, and for overall help during the procedure.  ANESTHESIOLOGY TEAM: Anesthesiologist: Santa Lighter, MD CRNA: Asher Muir, CRNA  INDICATIONS: Katelyn White is a 21 y.o. G2P1001 at [redacted]w[redacted]d here for cesarean section secondary to the indications listed under preoperative diagnoses; please see preoperative note for further details.  The risks of surgery were discussed with the patient including but were not limited to: bleeding which may require transfusion or reoperation; infection which may require antibiotics; injury to bowel, bladder, ureters or other surrounding organs; injury to the fetus; need for additional procedures including hysterectomy in the event of a life-threatening hemorrhage; formation of adhesions; placental abnormalities wth subsequent pregnancies; incisional problems; thromboembolic phenomenon and other postoperative/anesthesia complications.  The patient concurred with the proposed plan, giving informed written consent for the procedure.    FINDINGS:  Viable female infant in breech presentation x2.  Apgars pending.  Clear amniotic fluid.  Intact placenta, three vessel cord.   ANESTHESIA: Spinal INTRAVENOUS FLUIDS: 1000 ml   ESTIMATED BLOOD LOSS: 772 ml URINE OUTPUT:  225 ml SPECIMENS: Placenta sent to pathology COMPLICATIONS: None immediate  PROCEDURE IN DETAIL:  The patient  preoperatively received intravenous antibiotics and had sequential compression devices applied to her lower extremities.  She was then taken to the operating room where spinal anesthesia was administered and was found to be adequate. She was then placed in a dorsal supine position with a leftward tilt, and prepped and draped in a sterile manner.  A foley catheter was placed into her bladder and attached to constant gravity.  After an adequate timeout was performed, a Pfannenstiel skin incision was made with scalpel and carried through to the underlying layer of fascia. The fascia was incised in the midline, and this incision was extended bilaterally in a blunt fashion.  The underlying rectus muscles were dissected off the fascia superiorly and inferiorly in a blunt fashion. The rectus muscles were separated in the midline and the peritoneum was entered bluntly. The Alexis self-retaining retractor was introduced into the abdominal cavity.  Attention was turned to the lower uterine segment where a low transverse hysterotomy was made with a scalpel and extended bilaterally bluntly. Twin A was successfully delivered breech, the cord was clamped and cut immediately, and the infant was handed over to the awaiting neonatology team. Twin B was successfully delivered in the same manner and they were subsequently taken to the NICU. Uterine massage was then administered, and the placenta delivered intact with a three-vessel cord. The uterus was then cleared of clots and debris.  The hysterotomy was closed with 0 Chromic in a running locked fashion.  The pelvis was cleared of all clot and debris. Hemostasis was confirmed on all surfaces.  The retractor was removed.  The peritoneum was closed with a 0 Vicryl running stitch and the rectus muscles were reapproximated using 0 Vicryl running stitches. The fascia was then closed using 0 Vicryl in a running fashion.  The subcutaneous layer was irrigated, reapproximated with 2-0 vicryl  interrupted stitches, and the skin  was closed with a 4-0 Vicryl subcuticular stitch. The patient tolerated the procedure well. Sponge, instrument and needle counts were correct x 3.  She was taken to the recovery room in stable condition.   [name], DO OB Fellow, Faculty [facility], Center for [facility] 04/24/2022, 10:24 AM

## 2022-04-24 NOTE — Anesthesia Postprocedure Evaluation (Signed)
Anesthesia Post Note  Patient: Brunette T Fredman  Procedure(s) Performed: [place]     Patient location during evaluation: PACU Anesthesia Type: Spinal Level of consciousness: awake, awake and alert and oriented Pain management: pain level controlled Vital Signs Assessment: post-procedure vital signs reviewed and stable Respiratory status: spontaneous breathing, nonlabored ventilation and respiratory function stable Cardiovascular status: blood pressure returned to baseline and stable Postop Assessment: no headache, no backache, spinal receding and no apparent nausea or vomiting Anesthetic complications: no   No notable events documented.  Last Vitals:  Vitals:   04/24/22 1247 04/24/22 1330  BP: (!) 132/51 (!) 115/49  Pulse: 69 69  Resp: 17 16  Temp: 36.8 C 36.9 C  SpO2: 97% 96%    Last Pain:  Vitals:   04/24/22 1330  TempSrc: Oral  PainSc:    Pain Goal:                   Santa Lighter

## 2022-04-24 NOTE — Transfer of Care (Signed)
Immediate Anesthesia Transfer of Care Note  Patient: Katelyn White  Procedure(s) Performed: CESAREAN SECTION  Patient Location: PACU  Anesthesia Type:Spinal  Level of Consciousness: awake  Airway & Oxygen Therapy: Patient Spontanous Breathing  Post-op Assessment: Report given to RN  Post vital signs: Reviewed and stable  Last Vitals:  Vitals Value Taken Time  BP 112/70 04/24/22 1016  Temp    Pulse 88 04/24/22 1019  Resp 23 04/24/22 1019  SpO2 98 % 04/24/22 1019  Vitals shown include unvalidated device data.  Last Pain:  Vitals:   04/24/22 0705  TempSrc: Oral  PainSc: 0-No pain         Complications: No notable events documented.

## 2022-04-24 NOTE — Discharge Summary (Signed)
Postpartum Discharge Summary     Patient Name: Katelyn White DOB: Oct 22, 2000 MRN: [id number]  Date of admission: 04/24/2022 Delivery date:   Katelyn White [[id number]]  04/24/2022    Katelyn White [[id number]]  04/24/2022  Delivering provider:    [name], White [[id number]]  Katelyn White [[id number]]  Katelyn White  Date of discharge: 04/27/2022  Admitting diagnosis: S/P cesarean section [Z98.891] Intrauterine pregnancy: [redacted]w[redacted]d    Secondary diagnosis:  Principal Problem:   S/P cesarean section Active Problems:   Twin delivery by C-section  Additional problems: n/a    Discharge diagnosis: Term Pregnancy Delivered and Type 2 DM                                              Post partum procedures: n/a Augmentation: N/A Complications: None  Hospital course: Onset of Labor With Unplanned C/S   21y.o. yo G2P1001 at 355w0dresented to the MAU in PTL and was admitted on 04/24/2022. The patient went for cesarean section due [place]  [place] . Delivery details as follows: Membrane Rupture Time/Date:    RoLenoir, Facchini0[[id number]]9:31 AM    RoYoshika, Vensel0[[id number]]9:32 AM ,   RoDeairra, Halleck0[[id number]]04/24/2022    RoKatyana, Trolinger0[[id number]]04/24/2022   Delivery Method:   Katelyn Koch0[[id number]]C-Section, Low Transverse    RoDaritza, Brees0[[id number]]C-Section, Low Transverse  Details of operation can be found in separate operative note. Patient had a postpartum course complicated by none.  She is ambulating,tolerating a regular diet, passing flatus, and urinating well.  Patient is discharged home in stable condition 04/27/22.  Newborn Data: Birth date:   RoMycala, Warshawsky0[[id number]]04/24/2022    RoSarabelle, Genson0[[id number]]04/24/2022  Birth time:   RoDraya, Felker0[[id number]]9:31 AM    Katelyn White[0[[id number]]9:32 AM  Gender:   RoValerie, Cones0[[id number]]Female    RoArtemisia, Auvil0[[id number]]Female  Living status:   RoVerlee, Pope0[[id number]]Living    RoAltovise, Wahler0[[id number]]Living  Apgars:   RoRoniesha, Hollingshead0[[id number]]9 [address]0[[id number]]7 ,   RoAvery, Eustice0[[id number]]10    RoKlani, [place]0[[id number]]7  Weight:   RoSherrin, Stahle0[[id number]]2030 g    RoDenecia, Brunette0[[id number]]2100 g   Magnesium Sulfate received: No BMZ received: Yes Rhophylac:No MMR:N/A T-DaP:Given prenatally Flu: N/A Transfusion:No  Physical exam  Vitals:   04/26/22 0623 04/26/22 1251 04/26/22 2100 04/27/22 0534  BP: 107/60 116/75 127/79 110/73  Pulse: 73 97 (!) 103 73  Resp: 16 16 18 16   Temp: 98.5 F (36.9 C) 97.6 F (36.4 C) 97.7 F (36.5 C) 98 F (36.7 C)  TempSrc: Oral Oral Oral Oral  SpO2: 99% 99% 99% 98%  Weight:      Height:       General: alert, cooperative, and no distress Lochia: appropriate Uterine Fundus: firm Incision: Healing well with no significant drainage, No significant erythema, Dressing is clean, dry, and intact DVT Evaluation: Calf/Ankle edema is present Labs: Lab Results  Component Value Date   WBC  14.2 (H) 04/25/2022   HGB 8.6 (White) 04/25/2022   HCT 26.5 (White) 04/25/2022   MCV 77.9 (White) 04/25/2022   PLT 223 04/25/2022      Latest Ref Rng & Units 04/24/2022    5:48 AM  CMP  Glucose 70 - 99 mg/dL 103   BUN 6 - 20 mg/dL 8   Creatinine 0.44 - 1.00 mg/dL 0.43   Sodium 135 - 145 mmol/White 134   Potassium 3.5 - 5.1 mmol/White 4.0   Chloride 98 - 111 mmol/White 106   CO2 22 - 32 mmol/White 17   Calcium 8.9 - 10.3 mg/dL 9.0   Total Protein 6.5 - 8.1 g/dL 5.9   Total Bilirubin 0.3 - 1.2 mg/dL 0.3   Alkaline Phos 38 - 126 U/White 152   AST 15 - 41 U/White 20   ALT 0 - 44 U/White 13    Edinburgh Score:    04/26/2022    9:51 AM  Edinburgh Postnatal Depression Scale  Screening Tool  I have been able to laugh and see the funny side of things. 0  I have looked forward with enjoyment to things. 0  I have blamed myself unnecessarily when things went wrong. 0  I have been anxious or worried for no good reason. 2  I have felt scared or panicky for no good reason. 1  Things have been getting on top of me. 1  I have been so unhappy that I have had difficulty sleeping. 0  I have felt sad or miserable. 0  I have been so unhappy that I have been crying. 0  The thought of harming myself has occurred to me. 0  Edinburgh Postnatal Depression Scale Total 4     After visit meds:  Allergies as of 04/27/2022   No Known Allergies      Medication List     STOP taking these medications    Blood Pressure Kit Devi   insulin glargine 100 UNIT/ML Solostar Pen Commonly known as: LANTUS   insulin lispro 100 UNIT/ML KwikPen Commonly known as: HumaLOG KwikPen   Pen Needles 32G X 5 MM Misc       TAKE these medications    acetaminophen 500 MG tablet Commonly known as: TYLENOL Take 2 tablets (1,000 mg total) by mouth every 8 (eight) hours as needed for moderate pain.   Blood Glucose Test Strips 333 Strp 1 strip by In Vitro route in the morning, at noon, in the evening, and at bedtime.   ibuprofen 600 MG tablet Commonly known as: ADVIL Take 1 tablet (600 mg total) by mouth every 6 (six) hours.   metformin 500 MG (OSM) 24 hr tablet Commonly known as: FORTAMET Take 1 tablet (500 mg total) by mouth daily with breakfast.   oxyCODONE 5 MG immediate release tablet Commonly known as: Oxy IR/ROXICODONE Take 1-2 tablets (5-10 mg total) by mouth every 6 (six) hours as needed for up to 7 days for severe pain.   prenatal multivitamin Tabs tablet Take 1 tablet by mouth daily at 12 noon.   senna-docusate 8.6-50 MG tablet Commonly known as: Senokot-S Take 2 tablets by mouth daily.               Discharge Care Instructions  (From admission, onward)            Start     Ordered   04/27/22 0000  Discharge wound care:       Comments: C-section wound care: You may feel pain/discomfort/burning sensation  for several weeks. Keep the wound area clean by washing it with mild soap and water. You don't need to scrub it. Often, just letting the water run over your wound in the shower is enough. We do not recommend creams as this can cause infection, but we do recommend oral medication (prescribed), pressure dressings and running warm water on the wound to help ease discomfort/burning pain.   04/27/22 0815             Discharge home in stable condition Infant Feeding: Breast Infant Disposition:NICU Discharge instruction: per After Visit Summary and Postpartum booklet. Activity: Advance as tolerated. Pelvic rest for 6 weeks.  Diet: carb modified diet Future Appointments: Future Appointments  Date Time Provider [place]  05/01/2022  9:20 AM [facility] NURSE [facility]  06/03/2022  3:55 PM Autry-Lott, Naaman Plummer, DO [facility]   Follow up Visit:  Message sent to [facility] by Autry-Lott on 04/27/2022  Please schedule this patient for a In person postpartum visit in 6 weeks with the following provider: MD and APP. Additional Postpartum F/U:Incision check 1 week  High risk pregnancy complicated by:  Twin gestation, FGR, pre-existing diabetes requiring insulin Delivery mode:     Karolyne, Timmons [[id number]]  C-Section, Low Transverse    Zetta, Stoneman [[id number]]  C-Section, Low Transverse  Anticipated Birth Control:  Unsure   04/27/2022 Shelda Pal, DO

## 2022-04-24 NOTE — Anesthesia Procedure Notes (Signed)
Spinal  Patient location during procedure: OR Start time: 04/24/2022 9:05 AM End time: 04/24/2022 9:08 AM Reason for block: surgical anesthesia Staffing Performed: anesthesiologist  Anesthesiologist: Santa Lighter, MD Performed by: Santa Lighter, MD Authorized by: Santa Lighter, MD   Preanesthetic Checklist Completed: patient identified, IV checked, risks and benefits discussed, surgical consent, monitors and equipment checked, pre-op evaluation and timeout performed Spinal Block Patient position: sitting Prep: DuraPrep and site prepped and draped Patient monitoring: continuous pulse ox and blood pressure Approach: midline Location: L3-4 Injection technique: single-shot Needle Needle type: Pencan  Needle gauge: 24 G Assessment Sensory level: T6 Events: CSF return Additional Notes Functioning IV was confirmed and monitors were applied. Sterile prep and drape, including hand hygiene, mask and sterile gloves were used. The patient was positioned and the spine was prepped. The skin was anesthetized with lidocaine.  Free flow of clear CSF was obtained prior to injecting local anesthetic into the CSF.  The spinal needle aspirated freely following injection.  The needle was carefully withdrawn.  The patient tolerated the procedure well. Consent was obtained prior to procedure with all questions answered and concerns addressed. Risks including but not limited to bleeding, infection, nerve damage, paralysis, failed block, inadequate analgesia, allergic reaction, high spinal, itching and headache were discussed and the patient wished to proceed.   Hoy Morn, MD

## 2022-04-25 ENCOUNTER — Encounter (HOSPITAL_COMMUNITY): Payer: Self-pay | Admitting: Obstetrics and Gynecology

## 2022-04-25 LAB — CBC
HCT: 26.5 % — ABNORMAL LOW (ref 36.0–46.0)
Hemoglobin: 8.6 g/dL — ABNORMAL LOW (ref 12.0–15.0)
MCH: 25.3 pg — ABNORMAL LOW (ref 26.0–34.0)
MCHC: 32.5 g/dL (ref 30.0–36.0)
MCV: 77.9 fL — ABNORMAL LOW (ref 80.0–100.0)
Platelets: 223 10*3/uL (ref 150–400)
RBC: 3.4 MIL/uL — ABNORMAL LOW (ref 3.87–5.11)
RDW: 16.2 % — ABNORMAL HIGH (ref 11.5–15.5)
WBC: 14.2 10*3/uL — ABNORMAL HIGH (ref 4.0–10.5)
nRBC: 0 % (ref 0.0–0.2)

## 2022-04-25 LAB — GLUCOSE, CAPILLARY
Glucose-Capillary: 93 mg/dL (ref 70–99)
Glucose-Capillary: 82 mg/dL (ref 70–99)
Glucose-Capillary: 88 mg/dL (ref 70–99)

## 2022-04-25 MED ORDER — SODIUM CHLORIDE 0.9 % IV SOLN
500.0000 mg | Freq: Once | INTRAVENOUS | Status: DC
  Filled 2022-04-25: qty 25

## 2022-04-25 MED ORDER — ACETAMINOPHEN 500 MG PO TABS
1000.0000 mg | ORAL_TABLET | Freq: Three times a day (TID) | ORAL | Status: DC | PRN
  Administered 2022-04-26: 1000 mg via ORAL
  Filled 2022-04-25: qty 2

## 2022-04-25 MED ORDER — OXYCODONE HCL 5 MG PO TABS
5.0000 mg | ORAL_TABLET | Freq: Four times a day (QID) | ORAL | Status: DC | PRN
  Administered 2022-04-25 (×2): 10 mg via ORAL
  Administered 2022-04-25: 5 mg via ORAL
  Administered 2022-04-26 – 2022-04-27 (×2): 10 mg via ORAL
  Filled 2022-04-25: qty 2
  Filled 2022-04-25: qty 1
  Filled 2022-04-25: qty 2
  Filled 2022-04-25 (×2): qty 1
  Filled 2022-04-25: qty 2

## 2022-04-25 MED ORDER — IBUPROFEN 600 MG PO TABS
600.0000 mg | ORAL_TABLET | Freq: Four times a day (QID) | ORAL | Status: DC
  Administered 2022-04-25 – 2022-04-27 (×8): 600 mg via ORAL
  Filled 2022-04-25 (×8): qty 1

## 2022-04-25 MED ORDER — POLYSACCHARIDE IRON COMPLEX 150 MG PO CAPS
150.0000 mg | ORAL_CAPSULE | Freq: Every day | ORAL | Status: DC
  Administered 2022-04-25 – 2022-04-26 (×2): 150 mg via ORAL
  Filled 2022-04-25 (×2): qty 1

## 2022-04-25 NOTE — Progress Notes (Signed)
Subjective: Postpartum Day 1: Cesarean Delivery Patient reports incisional pain and tolerating PO.    Objective: Vital signs in last 24 hours: Temp:  [98 F (36.7 C)-99.3 F (37.4 C)] 98.3 F (36.8 C) (10/27 0331) Pulse Rate:  [65-85] 68 (10/27 0331) Resp:  [16-22] 17 (10/27 0331) BP: (93-132)/(44-70) 93/47 (10/27 0331) SpO2:  [94 %-99 %] 98 % (10/27 0331)  Physical Exam:  General: alert, cooperative, and no distress Lochia: appropriate Uterine Fundus: firm Incision: no significant drainage DVT Evaluation: No evidence of DVT seen on physical exam.  Recent Labs    04/24/22 0548 04/25/22 0508  HGB 11.8* 8.6*  HCT 36.2 26.5*    Assessment/Plan: Status post Cesarean section. Doing well postoperatively.  Continue current care.  Hansel Feinstein, CNM 04/25/2022, 7:39 AM

## 2022-04-25 NOTE — Addendum Note (Signed)
Addendum  created 04/25/22 1114 by Santa Lighter, MD   Intraprocedure Event deleted

## 2022-04-25 NOTE — Progress Notes (Signed)
Patient screened out for psychosocial assessment since none of the following apply:  Psychosocial stressors documented in mother or baby's chart  Gestation less than 32 weeks  Code at delivery   Infant with anomalies Please contact the Clinical Social Worker if specific needs arise, by MOB's request, or if MOB scores greater than 9/yes to question 10 on Edinburgh Postpartum Depression Screen.  Vamsi Apfel, LCSW Clinical Social Worker [facility] Cell#: [phone]     

## 2022-04-25 NOTE — Lactation Note (Signed)
This note was copied from a baby's chart.  NICU Lactation Consultation Note  Patient Name: Katelyn White IHKVQ'Q Date: 04/25/2022 Age:23 hours   Subjective Reason for consult: Follow-up assessment; NICU baby; Multiple gestation [facility] assisted with pumping session and reinforced previous ed. Encouraged frequent pump use and use of Symphony during initiation of lactation. Pt has hands-free option for home use.   Objective Infant data: Mother's Current Feeding Choice: Breast Milk and Donor Milk  Infant feeding assessment Scale for Readiness: 3   Maternal data: G2P1103  C-Section, Low Transverse Does the patient have breastfeeding experience prior to this delivery?: No  Pumping frequency: 2 x in first 24 hours  Risk factor for low milk supply:: Hx T2DM, 844cc blood loss  Pump: DEBP, Personal 46mm flange  Assessment Maternal: Normal breast development   Intervention/Plan Interventions: Education; DEBP  Tools: Pump Pump Education: Setup, frequency, and cleaning; Milk Storage  Plan: Consult Status: NICU follow-up  NICU Follow-up type: New admission follow up; Maternal D/C visit; Verify onset of copious milk; Verify absence of engorgement  Pump q3h and bring any EBM to NICU.   [name] 04/25/2022, 9:34 AM

## 2022-04-26 ENCOUNTER — Other Ambulatory Visit: Payer: Self-pay

## 2022-04-26 LAB — GLUCOSE, CAPILLARY
Glucose-Capillary: 110 mg/dL — ABNORMAL HIGH (ref 70–99)
Glucose-Capillary: 118 mg/dL — ABNORMAL HIGH (ref 70–99)
Glucose-Capillary: 79 mg/dL (ref 70–99)
Glucose-Capillary: 81 mg/dL (ref 70–99)

## 2022-04-26 NOTE — Progress Notes (Signed)
POSTPARTUM PROGRESS NOTE  POD #2  Subjective:  Katelyn White is a 21 y.o. S2G3151 s/p pLTCS at [redacted]w[redacted]d.  She reports she doing well. No acute events overnight. She reports she is doing well. She denies any problems with ambulating, voiding or po intake. Denies nausea or vomiting. She has passed flatus. Pain is moderately controlled.  Lochia is adequate. Denies CP, SOB, RUQ pain, HA, vision changes.  Objective: Blood pressure 107/60, pulse 73, temperature 98.5 F (36.9 C), temperature source Oral, resp. rate 16, height 5\' 4"  (1.626 m), weight 103.4 kg, last menstrual period 09/02/2021, SpO2 99 %, unknown if currently breastfeeding.  Physical Exam:  General: alert, cooperative and no distress Chest: no respiratory distress Heart:regular rate, distal pulses intact Abdomen: soft, nontender,  Uterine Fundus: firm, appropriately tender DVT Evaluation: No calf swelling or tenderness Extremities: mild edema, SCDs on Skin: warm, dry; incision clean/dry/intact w/honeycomb dressing in place  Recent Labs    04/24/22 0548 04/25/22 0508  HGB 11.8* 8.6*  HCT 36.2 26.5*    Assessment/Plan: Katelyn White is a 21 y.o. V6H6073 s/p pLTCS at [redacted]w[redacted]d for mo/di twins.  POD#2 - Doing welll; pain moderately controlled.   Routine postpartum care  OOB, ambulated  Lovenox and SCDs for VTE prophylaxis Anemia: asymptomatic  Hb8.6  Pt declined Venofer  PO ferrous sulfate Diabetes Mellitus:  Lantus 20  qhs +SS Contraception: declines Feeding: breast  Dispo: Plan for discharge tomorrow.   LOS: 2 days   Katelyn Cash, MD PGY-1 Family Medicine Resident [place] 04/26/2022, 7:59 AM

## 2022-04-26 NOTE — Progress Notes (Signed)
POSTPARTUM PROGRESS NOTE  POD #2  Subjective:  Katelyn White is a 21 y.o. J6B3419 s/p pLTCS at [redacted]w[redacted]d.  She reports she doing well. No acute events overnight. She reports she is doing well. She denies any problems with ambulating, voiding or po intake. Denies nausea or vomiting. She has passed flatus. Pain is moderately controlled.  Lochia is minimal. She has not had an insulin for more than 24hrs, blood glucose numbers have been good.  Objective: Blood pressure 116/75, pulse 97, temperature 97.6 F (36.4 C), temperature source Oral, resp. rate 16, height 5\' 4"  (1.626 m), weight 103.4 kg, last menstrual period 09/02/2021, SpO2 99 %, unknown if currently breastfeeding.  Physical Exam:  General: alert, cooperative and no distress Chest: no respiratory distress Heart:regular rate, distal pulses intact Abdomen: soft, nontender,  Uterine Fundus: firm, appropriately tender DVT Evaluation: No calf swelling or tenderness Extremities: mild edema Skin: warm, dry; incision clean/dry/intact w/ honeycomb dressing in place  Recent Labs    04/24/22 0548 04/25/22 0508  HGB 11.8* 8.6*  HCT 36.2 26.5*    Assessment/Plan: Katelyn White is a 21 y.o. F7T0240 s/p pLTCS at [redacted]w[redacted]d for mo/di breech/breech twins.  POD#2 - Doing welll; pain moderately controlled. H/H appropriate  Routine postpartum care  OOB, ambulated - encouraged continuing to spend some time out of bed to aid recovery  Lovenox for VTE prophylaxis Anemia: asymptomatic. Hgb 8.6  Had declined IV venofer due to need for new IV access On PO Ferrous sulphate Contraception: declines Feeding: breast  Diabetes: thought to be type 2, but may also be GDM A2. Transfer[place] to [place] form [place] at 24 weeks. Has had no insulin for 24hrs and BS still good. Monitor bs closely. May be able to discharge home with no diabetes meds.  Dispo: Plan for discharge tomorrow.   LOS: 2 days   Liliane Channel MD MPH OB Fellow, [place] for [place] 04/26/2022

## 2022-04-27 ENCOUNTER — Ambulatory Visit: Payer: Self-pay

## 2022-04-27 LAB — GLUCOSE, CAPILLARY: Glucose-Capillary: 81 mg/dL (ref 70–99)

## 2022-04-27 MED ORDER — IBUPROFEN 600 MG PO TABS: 600.0000 mg | ORAL_TABLET | Freq: Four times a day (QID) | ORAL | 0 refills | Status: DC

## 2022-04-27 MED ORDER — OXYCODONE HCL 5 MG PO TABS: 5.0000 mg | ORAL_TABLET | Freq: Four times a day (QID) | ORAL | 0 refills | Status: DC | PRN

## 2022-04-27 MED ORDER — METFORMIN HCL ER (OSM) 500 MG PO TB24: 500.0000 mg | ORAL_TABLET | Freq: Every day | ORAL | 0 refills | Status: AC

## 2022-04-27 MED ORDER — SENNOSIDES-DOCUSATE SODIUM 8.6-50 MG PO TABS: 2.0000 | ORAL_TABLET | Freq: Every day | ORAL | 0 refills | Status: AC

## 2022-04-27 MED ORDER — OXYCODONE HCL 5 MG PO TABS: 5.0000 mg | ORAL_TABLET | Freq: Four times a day (QID) | ORAL | 0 refills | Status: AC | PRN

## 2022-04-27 MED ORDER — ACETAMINOPHEN 500 MG PO TABS: 1000.0000 mg | ORAL_TABLET | Freq: Three times a day (TID) | ORAL | 0 refills | Status: AC | PRN

## 2022-04-27 NOTE — Lactation Note (Signed)
This note was copied from a baby's chart.  NICU Lactation Consultation Note  Patient Name: Katelyn White NSHPH'R Date: 04/27/2022 Age:24 hours  Subjective Reason for consult: Follow-up assessment; 1st time breastfeeding; NICU baby; Multiple gestation; Preterm <34wks  Lactation followed up with birth parent while she was using her personal breast pump. She states that she is pumping 8 times a day for 30 minutes. I emphasized the importance of pumping consistently including at night and early morning.  We set up a follow up appointment for 10/30 at 1100 to set up Symphony pump. Parent left her kit in her room at discharge and no longer has it. She experienced some pain with pumping. We will make sure her settings are correct and she has the correct fit for flanges.  Objective Infant data: Mother's Current Feeding Choice: Breast Milk and Donor Milk  Infant feeding assessment Scale for Readiness: 4  Maternal data: G2P1103  C-Section, Low Transverse  Current breast feeding challenges:: NICU Does the patient have breastfeeding experience prior to this delivery?: No  Pumping frequency: states that she is now pumping q3 hours Pumped volume: 30 mL   Pump: Personal, Hands Free (Mom Cozy)  Assessment  Maternal: Milk volume: Normal   Intervention/Plan  Pump Education: Setup, frequency, and cleaning; Milk Storage  Plan: Consult Status: NICU follow-up  NICU Follow-up type: New admission follow up; Verify onset of copious milk; Verify absence of engorgement   Lenore Manner 04/27/2022, 4:56 PM

## 2022-04-28 ENCOUNTER — Encounter: Payer: Medicaid Other | Admitting: Family Medicine

## 2022-04-28 ENCOUNTER — Ambulatory Visit: Payer: Medicaid Other

## 2022-04-28 LAB — SURGICAL PATHOLOGY

## 2022-05-01 ENCOUNTER — Ambulatory Visit: Payer: Medicaid Other

## 2022-05-05 ENCOUNTER — Ambulatory Visit: Payer: Medicaid Other

## 2022-05-05 ENCOUNTER — Ambulatory Visit (INDEPENDENT_AMBULATORY_CARE_PROVIDER_SITE_OTHER): Payer: Medicaid Other | Admitting: *Deleted

## 2022-05-05 ENCOUNTER — Ambulatory Visit: Payer: Self-pay

## 2022-05-05 ENCOUNTER — Other Ambulatory Visit: Payer: Self-pay

## 2022-05-05 VITALS — BP 117/73 | HR 75 | Ht 64.0 in | Wt 202.9 lb

## 2022-05-05 DIAGNOSIS — Z4889 Encounter for other specified surgical aftercare: Secondary | ICD-10-CM

## 2022-05-05 NOTE — Progress Notes (Signed)
Here for Incision check s/p emergency C/S for breech Twins at 33 weeks on 04/24/22. BP wnl. Incision CDI with steristrips. Scant, dark brown dried bloody discharge noted in center of incision . Patient nervous about removing steristrips. Removed 1/2 of steristrips and advised patient to remove 1-2 steristrips daily until all removed with the next 5 days. Incision remains CDI without redness or discharge. Wound care reviewed with patient and postpartum appointment. She voices understanding. Staci Acosta

## 2022-05-05 NOTE — Lactation Note (Signed)
This note was copied from a baby's chart.  NICU Lactation Consultation Note  Patient Name: Katelyn White HEKBT'C Date: 05/05/2022 Age:23 days  Subjective Reason for consult: Follow-up assessment; NICU baby; Multiple gestation; Maternal endocrine disorder; Late-preterm 68-36.6wks  Visited with family of 71 62/22 weeks old (adjusted) NICU twin females; Ms. Goosby is a a P2 and reports pumping is going well. She asked for strategies to increase supply, let her know that nipple stimulation will be the safest way to increase supply. She voiced that her pump at home is not as powerful as the hospital grade pump. Revised galactagogues, pumping schedule, lactogenesis III and readiness for PO feedings. Ms. Loflin plans taking babies to breast once they are ready, she would also like to work on bottle feedings with her EBM. Provided extra bottles and 8 oz. bottles per her request.  Objective Infant data: Mother's Current Feeding Choice: Breast Milk  Infant feeding assessment Scale for Readiness: 4  Maternal data: G2P1103  C-Section, Low Transverse Current breast feeding challenges:: NICU admission Does the patient have breastfeeding experience prior to this delivery?: No Pumping frequency: 6-7 times/24 hours Pumped volume: 150 mL (150-180 ml) Flange Size: 24 Risk factor for low milk supply:: prematurity, infant separation, blood loss of 844 cc. Pump: Personal (Wireless pump at home)  Assessment Infant: In NICU  Maternal: Milk volume: Normal  Intervention/Plan Interventions: Breast feeding basics reviewed; Expressed milk; DEBP; Education Tools: Pump; Flanges Pump Education: Setup, frequency, and cleaning; Milk Storage  Plan of care: Encouraged pumping every 3 hours, ideally 8 pumping sessions/24 hours She'll call for latch assistance once babies are ready  FOB present. All questions and concerns answered, family to contact [facility] services PRN.  Consult Status: NICU  follow-up  NICU Follow-up type: Weekly NICU follow up; Assist with IDF-1 (Mother to pre-pump before breastfeeding)   Katelyn White Katelyn White 05/05/2022, 5:32 PM

## 2022-05-08 ENCOUNTER — Inpatient Hospital Stay (HOSPITAL_COMMUNITY)
Admission: AD | Admit: 2022-05-08 | Discharge: 2022-05-08 | Disposition: A | Discharge status: Home / Self Care | Payer: Medicaid Other | Attending: Obstetrics and Gynecology | Admitting: Obstetrics and Gynecology

## 2022-05-08 ENCOUNTER — Inpatient Hospital Stay (HOSPITAL_BASED_OUTPATIENT_CLINIC_OR_DEPARTMENT_OTHER): Payer: Medicaid Other

## 2022-05-08 ENCOUNTER — Encounter (HOSPITAL_COMMUNITY): Payer: Self-pay | Admitting: Obstetrics and Gynecology

## 2022-05-08 DIAGNOSIS — Z79899 Other long term (current) drug therapy: Secondary | ICD-10-CM | POA: Diagnosis not present

## 2022-05-08 DIAGNOSIS — O9089 Other complications of the puerperium, not elsewhere classified: Secondary | ICD-10-CM | POA: Diagnosis not present

## 2022-05-08 DIAGNOSIS — R52 Pain, unspecified: Secondary | ICD-10-CM

## 2022-05-08 DIAGNOSIS — Z98891 History of uterine scar from previous surgery: Secondary | ICD-10-CM | POA: Diagnosis not present

## 2022-05-08 DIAGNOSIS — Z7984 Long term (current) use of oral hypoglycemic drugs: Secondary | ICD-10-CM | POA: Diagnosis not present

## 2022-05-08 DIAGNOSIS — M79662 Pain in left lower leg: Secondary | ICD-10-CM | POA: Diagnosis present

## 2022-05-08 HISTORY — DX: Gestational diabetes mellitus in pregnancy, unspecified control: O24.419

## 2022-05-08 LAB — COMPREHENSIVE METABOLIC PANEL
ALT: 27 U/L (ref 0–44)
AST: 40 U/L (ref 15–41)
Albumin: 3.1 g/dL — ABNORMAL LOW (ref 3.5–5.0)
Alkaline Phosphatase: 114 U/L (ref 38–126)
Anion gap: 9 (ref 5–15)
BUN: 11 mg/dL (ref 6–20)
CO2: 23 mmol/L (ref 22–32)
Calcium: 9 mg/dL (ref 8.9–10.3)
Chloride: 108 mmol/L (ref 98–111)
Creatinine, Ser: 0.67 mg/dL (ref 0.44–1.00)
GFR, Estimated: >60 mL/min (ref >60)
Glucose, Bld: 98 mg/dL (ref 70–99)
Potassium: 3.8 mmol/L (ref 3.5–5.1)
Sodium: 140 mmol/L (ref 135–145)
Total Bilirubin: 0.3 mg/dL (ref 0.3–1.2)
Total Protein: 6.5 g/dL (ref 6.5–8.1)

## 2022-05-08 LAB — CBC
HCT: 33.6 % — ABNORMAL LOW (ref 36.0–46.0)
Hemoglobin: 10.5 g/dL — ABNORMAL LOW (ref 12.0–15.0)
MCH: 24.2 pg — ABNORMAL LOW (ref 26.0–34.0)
MCHC: 31.3 g/dL (ref 30.0–36.0)
MCV: 77.4 fL — ABNORMAL LOW (ref 80.0–100.0)
Platelets: 422 10*3/uL — ABNORMAL HIGH (ref 150–400)
RBC: 4.34 MIL/uL (ref 3.87–5.11)
RDW: 16.3 % — ABNORMAL HIGH (ref 11.5–15.5)
WBC: 8.5 10*3/uL (ref 4.0–10.5)
nRBC: 0 % (ref 0.0–0.2)

## 2022-05-08 NOTE — MAU Note (Addendum)
Katelyn White is a 21 y.o. at [redacted]w[redacted]d here in MAU reporting: c/s on 10/26. Feeling a lot of pulling pressure,like it's tearing on the left side.  Ever since the surgery, her left calf muscle has been cramping up really bad. (Did not mention this at [facility] on 11/6). No drainage or bleeding from incision. "The outside seems ok,  it is just the inside"  Onset of complaint: this morning Pain score: 10 Vitals:   05/08/22 1645  BP: (!) 122/59  Pulse: 90  Resp: 17  Temp: 98.1 F (36.7 C)  SpO2: 97%      Lab orders placed from triage:

## 2022-05-08 NOTE — MAU Note (Signed)
Incision is healing well, remaining steri strips (4 removed.  No drainage.  Approx 1in of incision, left of center, is not well approximated, scant moisture noted there.  No pain in that area.  Pain is only on the end on the left side. No redness or swelling.

## 2022-05-08 NOTE — MAU Provider Note (Signed)
History     [id number]  Arrival date and time: 05/08/22 1620    Chief Complaint  Patient presents with   Post-op Problem     HPI Katelyn White is a 21 y.o. with PMHx notable for C/S on 04/24/22 for PTL with twins, DM, who presents for incision concern and leg pain.   Patient underwent cesarean on 04/24/2022 at [redacted] weeks gestation due to preterm labor in setting of breech A twin of mono/di pregnancy. Uncomplicated postpartum course.   Today reports that since this morning has had a burning sensation in the L aspect of her cesarean incision scar Like a pulling, burning sensation No skin changes, no fevers, no bleeding or exudate from scar  Also has had pain in her L calf since delivery Like a muscle spasm that comes and goes Does not smoke No chest pain or SOB  --/--/O POS (10/26 0716)  OB History     Gravida  2   Para  2   Term  1   Preterm  1   AB  0   Living  3      SAB  0   IAB  0   Ectopic  0   Multiple  1   Live Births  3           Past Medical History:  Diagnosis Date   Gestational diabetes    Headache    Infection    UTI   Preterm labor     Past Surgical History:  Procedure Laterality Date   CESAREAN SECTION MULTI-GESTATIONAL N/A 04/24/2022   Procedure: CESAREAN SECTION MULTI-GESTATIONAL;  Surgeon: Hermina Staggers, MD;  Location: [facility] LD ORS;  Service: Obstetrics;  Laterality: N/A;    Family History  Problem Relation Age of Onset   Diabetes Mother    Stroke Father    Hypertension Father     Social History   Socioeconomic History   Marital status: Single    Spouse name: Not on file   Number of children: Not on file   Years of education: Not on file   Highest education level: Not on file  Occupational History   Not on file  Tobacco Use   Smoking status: Former    Years: 2.00    Types: Cigarettes    Quit date: 06/30/2020    Years since quitting: 1.8   Smokeless tobacco: Never  Vaping Use   Vaping Use: Former   Substance and Sexual Activity   Alcohol use: Not Currently    Comment: weekends   Drug use: No   Sexual activity: Not Currently    Birth control/protection: None  Other Topics Concern   Not on file  Social History Narrative   Not on file   Social Determinants of Health   Financial Resource Strain: Not on file  Food Insecurity: No Food Insecurity (04/26/2022)   Hunger Vital Sign    Worried About Running Out of Food in the Last Year: Never true    Ran Out of Food in the Last Year: Never true  Transportation Needs: No Transportation Needs (04/26/2022)   PRAPARE - Administrator, Civil Service (Medical): No    Lack of Transportation (Non-Medical): No  Physical Activity: Not on file  Stress: Not on file  Social Connections: Not on file  Intimate Partner Violence: Not At Risk (04/26/2022)   Humiliation, Afraid, Rape, and Kick questionnaire    Fear of Current or Ex-Partner: No    Emotionally  Abused: No    Physically Abused: No    Sexually Abused: No    No Known Allergies  No current facility-administered medications on file prior to encounter.   Current Outpatient Medications on File Prior to Encounter  Medication Sig Dispense Refill   ibuprofen (ADVIL) 600 MG tablet Take 1 tablet (600 mg total) by mouth every 6 (six) hours. 30 tablet 0   metformin (FORTAMET) 500 MG (OSM) 24 hr tablet Take 1 tablet (500 mg total) by mouth daily with breakfast. 30 tablet 0   Prenatal Vit-Fe Fumarate-FA (PRENATAL MULTIVITAMIN) TABS tablet Take 1 tablet by mouth daily at 12 noon.     acetaminophen (TYLENOL) 500 MG tablet Take 2 tablets (1,000 mg total) by mouth every 8 (eight) hours as needed for moderate pain. 30 tablet 0   Glucose Blood (BLOOD GLUCOSE TEST STRIPS 333) STRP 1 strip by In Vitro route in the morning, at noon, in the evening, and at bedtime. 100 strip 6   senna-docusate (SENOKOT-S) 8.6-50 MG tablet Take 2 tablets by mouth daily. (Patient not taking: Reported on 05/05/2022)  30 tablet 0     ROS Pertinent positives and negative per HPI, all others reviewed and negative  Physical Exam   BP 119/75   Pulse 87   Temp 98.1 F (36.7 C)   Resp 17   Ht 5\' 4"  (1.626 m)   Wt 93.2 kg   SpO2 97%   Breastfeeding Yes Comment: twins are in the NICU, "they are amazing, getting bigger every day". pumping is going well  BMI 35.27 kg/m   Patient Vitals for the past 24 hrs:  BP Temp Pulse Resp SpO2 Height Weight  05/08/22 1749 119/75 -- 87 -- -- -- --  05/08/22 1645 (!) 122/59 98.1 F (36.7 C) 90 17 97 % 5\' 4"  (1.626 m) 93.2 kg    Physical Exam Vitals reviewed.  Constitutional:      General: She is not in acute distress.    Appearance: She is well-developed. She is not diaphoretic.  Eyes:     General: No scleral icterus. Pulmonary:     Effort: Pulmonary effort is normal. No respiratory distress.  Abdominal:     General: There is no distension.     Palpations: Abdomen is soft.     Tenderness: There is no abdominal tenderness. There is no guarding or rebound.     Comments: No abdominal tenderness. Area of patient concern near scar without swelling, erythema, induration, warmth  Musculoskeletal:     Comments: No tenderness to palpation of L calf  Skin:    General: Skin is warm and dry.     Comments: Incision looks great, small superficial skin dehisence of about 1.5 cm in center but no exudate there, L aspect of incision very well healed  Neurological:     Mental Status: She is alert.     Coordination: Coordination normal.      Cervical Exam    Bedside Ultrasound Not done  My interpretation: n/a  FHT N/a  Labs Results for orders placed or performed during the hospital encounter of 05/08/22 (from the past 24 hour(s))  Comprehensive metabolic panel     Status: Abnormal   Collection Time: 05/08/22  5:42 PM  Result Value Ref Range   Sodium 140 135 - 145 mmol/L   Potassium 3.8 3.5 - 5.1 mmol/L   Chloride 108 98 - 111 mmol/L   CO2 23 22 - 32  mmol/L   Glucose, Bld 98 70 -  99 mg/dL   BUN 11 6 - 20 mg/dL   Creatinine, Ser 1.61 0.44 - 1.00 mg/dL   Calcium 9.0 8.9 - 09.6 mg/dL   Total Protein 6.5 6.5 - 8.1 g/dL   Albumin 3.1 (L) 3.5 - 5.0 g/dL   AST 40 15 - 41 U/L   ALT 27 0 - 44 U/L   Alkaline Phosphatase 114 38 - 126 U/L   Total Bilirubin 0.3 0.3 - 1.2 mg/dL   GFR, Estimated >04 >54 mL/min   Anion gap 9 5 - 15  CBC     Status: Abnormal   Collection Time: 05/08/22  5:42 PM  Result Value Ref Range   WBC 8.5 4.0 - 10.5 K/uL   RBC 4.34 3.87 - 5.11 MIL/uL   Hemoglobin 10.5 (L) 12.0 - 15.0 g/dL   HCT 09.8 (L) 11.9 - 14.7 %   MCV 77.4 (L) 80.0 - 100.0 fL   MCH 24.2 (L) 26.0 - 34.0 pg   MCHC 31.3 30.0 - 36.0 g/dL   RDW 82.9 (H) 56.2 - 13.0 %   Platelets 422 (H) 150 - 400 K/uL   nRBC 0.0 0.0 - 0.2 %    Imaging VAS [place] LOWER EXTREMITY VENOUS (DVT) (ONLY [facility] & [facility])  Result Date: 05/08/2022  Lower Venous DVT Study Patient Name:  KANDA DELUNA  Date of Exam:   05/08/2022 Medical Rec #: [id number]         Accession #:    [id number] Date of Birth: Nov 07, 2000         Patient Gender: F Patient Age:   36 years Exam Location:  [facility] Procedure:      VAS [place] LOWER EXTREMITY VENOUS (DVT) Referring Phys: Brittley Regner --------------------------------------------------------------------------------  Indications: Pain.  Risk Factors: None identified. Limitations: Poor ultrasound/tissue interface. Comparison Study: No prior studies. Performing Technologist: Chanda Busing RVT  Examination Guidelines: A complete evaluation includes B-mode imaging, spectral Doppler, color Doppler, and power Doppler as needed of all accessible portions of each vessel. Bilateral testing is considered an integral part of a complete examination. Limited examinations for reoccurring indications may be performed as noted. The reflux portion of the exam is performed with the patient in reverse Trendelenburg.   +-----+---------------+---------+-----------+----------+--------------+ RIGHTCompressibilityPhasicitySpontaneityPropertiesThrombus Aging +-----+---------------+---------+-----------+----------+--------------+ CFV  Full           Yes      Yes                                 +-----+---------------+---------+-----------+----------+--------------+   +---------+---------------+---------+-----------+----------+--------------+ LEFT     CompressibilityPhasicitySpontaneityPropertiesThrombus Aging +---------+---------------+---------+-----------+----------+--------------+ CFV      Full           Yes      Yes                                 +---------+---------------+---------+-----------+----------+--------------+ SFJ      Full                                                        +---------+---------------+---------+-----------+----------+--------------+ FV Prox  Full                                                        +---------+---------------+---------+-----------+----------+--------------+  FV Mid   Full                                                        +---------+---------------+---------+-----------+----------+--------------+ FV DistalFull                                                        +---------+---------------+---------+-----------+----------+--------------+ PFV      Full                                                        +---------+---------------+---------+-----------+----------+--------------+ POP      Full           Yes      Yes                                 +---------+---------------+---------+-----------+----------+--------------+ PTV      Full                                                        +---------+---------------+---------+-----------+----------+--------------+ PERO     Full                                                         +---------+---------------+---------+-----------+----------+--------------+    Summary: RIGHT: - No evidence of common femoral vein obstruction.  LEFT: - There is no evidence of deep vein thrombosis in the lower extremity.  - No cystic structure found in the popliteal fossa.  *See table(s) above for measurements and observations.    Preliminary     MAU Course  Procedures Lab Orders         Comprehensive metabolic panel         CBC    No orders of the defined types were placed in this encounter.  Imaging Orders         VAS [place] LOWER EXTREMITY VENOUS (DVT) (ONLY [facility] & [facility])     MDM moderate  Assessment and Plan  #Incision concern #S/p cesarean Incision without any obvious issues, no signs of infection, hematoma, etc. Reassured patient that sensations that she is experiencing are common post op.   #Left calf pain in post op patient Concerning for possible thrombosis. LE duplex obtained which was normal. Electrolytes and hgb were unremarkable. No clear etiology but ruled out for life threatening causes.    Dispo: discharged to home in stable condition.    Venora Maples, MD/MPH 05/08/22 6:44 PM  Allergies as of 05/08/2022   No Known Allergies      Medication List     TAKE these medications    acetaminophen  500 MG tablet Commonly known as: TYLENOL Take 2 tablets (1,000 mg total) by mouth every 8 (eight) hours as needed for moderate pain.   Blood Glucose Test Strips 333 Strp 1 strip by In Vitro route in the morning, at noon, in the evening, and at bedtime.   ibuprofen 600 MG tablet Commonly known as: ADVIL Take 1 tablet (600 mg total) by mouth every 6 (six) hours.   metformin 500 MG (OSM) 24 hr tablet Commonly known as: FORTAMET Take 1 tablet (500 mg total) by mouth daily with breakfast.   prenatal multivitamin Tabs tablet Take 1 tablet by mouth daily at 12 noon.   senna-docusate 8.6-50 MG tablet Commonly known as: Senokot-S Take 2 tablets by mouth daily.

## 2022-05-08 NOTE — Progress Notes (Signed)
Left lower extremity venous duplex has been completed. Preliminary results can be found in CV Proc through chart review.  Results were given to Dr. Crissie Reese.  05/08/22 5:41 PM Olen Cordial RVT

## 2022-05-08 NOTE — Lactation Note (Signed)
This note was copied from a baby's chart.  NICU Lactation Consultation Note  Patient Name: Katelyn White WKGSU'P Date: 05/08/2022 Age:23 wk.o.   Subjective Lactation followed up with birth parent. Support parent was in NICU room and made a video call to birth parent. I made an appointment for 1000 on Friday 11/10 to answer questions and assist with latching.   Intervention/Plan Plan: Consult Status: NICU follow-up  [name] 05/08/2022, 4:48 PM

## 2022-05-09 ENCOUNTER — Ambulatory Visit: Payer: Self-pay

## 2022-05-09 NOTE — Lactation Note (Addendum)
This note was copied from a baby's chart. LC presented to assist with breastfeeding. Mother had not yet pumped at feeding time. LC returned after mother pumped but infant had completed gavage and was sleeping soundly. Education provided and infant rescheduled for Sunday.

## 2022-05-09 NOTE — Lactation Note (Signed)
This note was copied from a baby's chart.  NICU Lactation Consultation Note  Patient Name: Masiya Claassen OINOM'V Date: 05/09/2022 Age:21 years, 0 months.   Subjective Reason for consult: Follow-up assessment; Breastfeeding assistance LC presented to assist with bf'ing. Mother pre-pumped. Infant did not wake during attempt and was gavage fed. We reviewed normalcy and rescheduled for Sunday at 1pm. Mother is aware to pre-pump.  Objective Infant data: Mother's Current Feeding Choice: Breast Milk  Infant feeding assessment Scale for Readiness: 3   Maternal data: G2P1103  C-Section, Low Transverse  Pumping frequency: q3h Pumped volume: 180 mL  No data recorded  Pump: Personal (Wireless pump at home)  Assessment Infant: LATCH Score: 5  Maternal: Milk volume: Normal   Intervention/Plan Interventions: Education; Position options; Infant Driven Feeding Algorithm education; Pre-pump if needed  Plan: Consult Status: NICU follow-up  NICU Follow-up type: Assist with IDF-1 (Mother to pre-pump before breastfeeding)  Continue to pump q3h and pre-pump prior to breastfeeding.   Elder Negus 05/09/2022, 2:55 PM

## 2022-05-11 ENCOUNTER — Inpatient Hospital Stay (HOSPITAL_COMMUNITY)
Admission: AD | Admit: 2022-05-11 | Discharge: 2022-05-11 | Disposition: A | Discharge status: Home / Self Care | Payer: Medicaid Other | Attending: Obstetrics and Gynecology | Admitting: Obstetrics and Gynecology

## 2022-05-11 ENCOUNTER — Ambulatory Visit: Payer: Self-pay

## 2022-05-11 DIAGNOSIS — O99893 Other specified diseases and conditions complicating puerperium: Secondary | ICD-10-CM | POA: Diagnosis not present

## 2022-05-11 DIAGNOSIS — Z98891 History of uterine scar from previous surgery: Secondary | ICD-10-CM

## 2022-05-11 DIAGNOSIS — M549 Dorsalgia, unspecified: Secondary | ICD-10-CM | POA: Diagnosis not present

## 2022-05-11 DIAGNOSIS — O900 Disruption of cesarean delivery wound: Secondary | ICD-10-CM | POA: Insufficient documentation

## 2022-05-11 MED ORDER — CYCLOBENZAPRINE HCL 10 MG PO TABS: 10.0000 mg | ORAL_TABLET | Freq: Two times a day (BID) | ORAL | 0 refills | Status: DC | PRN

## 2022-05-11 NOTE — Lactation Note (Signed)
This note was copied from a baby's chart.  NICU Lactation Consultation Note  Patient Name: Katelyn White WPYKD'X Date: 05/11/2022 Age:23 wk.o.  Subjective Reason for consult: Follow-up assessment; Breastfeeding assistance; NICU baby; Late-preterm 34-36.6wks; Infant < 6lbs; Maternal endocrine disorder  Visited with family of 87 88/58 weeks old (adjusted) NICU twin females; Ms. Altemose voiced she couldn't come for the 1 pm feeding because she was in MAU due to incision pain. She reported pumping is going well and that her pump at home is now working out better at emptying her breasts; she's also pumping more often; praised her for her efforts. She rescheduled the feeding assist for tomorrow at 10:30 and 11:30 am. Continue current plan of care.  Objective Infant data: Mother's Current Feeding Choice: Breast Milk  Infant feeding assessment Scale for Readiness: 2  Maternal data: G2P1103  C-Section, Low Transverse Pumping frequency: 7 times/24 hours Pumped volume: 120 mL Flange Size: 24 Pump: Personal (Wireless pump at home)  Assessment Infant: In NICU  Maternal: Milk volume: Normal  Intervention/Plan Interventions: Breast feeding basics reviewed; DEBP; Education Tools: Pump; Flanges Pump Education: Setup, frequency, and cleaning; Milk Storage  Plan: Consult Status: NICU follow-up  NICU Follow-up type: Assist with IDF-1 (Mother to pre-pump before breastfeeding)   Alejandra Hunt [name] 05/11/2022, 4:11 PM

## 2022-05-11 NOTE — MAU Note (Addendum)
...  Katelyn White is a 21 y.o. at 2 weeks 3 days post partum C/S here in MAU reporting: She reports her C/S incision is "opening." She reports Friday she was evaluated in MAU and "they took off all of my stickers on my incision." She reports once she got home after being discharged she noticed the left side of her incision was opening. She reports since then it has continued to open. She reports she has been tapping the area to keep it dry and notices a "dry patch" of blood where she wipes. She also reports she has been experiencing occasional "spasms" in her spine for the past week. She reports the pain starts in between her shoulders and radiates down her spine and up to her head and neck. Last felt this pain two days ago. Felt once during triage process.  Both babies are in the NICU.  Pain scores:  Denies incisional pain.  10/10 spine

## 2022-05-11 NOTE — Lactation Note (Signed)
This note was copied from a baby's chart. Lactation Consultation Note  Patient Name: Katelyn White OQHUT'M Date: 05/11/2022   Age:23 wk.o.  LC in the room for feeding assist for both babies but Ms. Arnold hasn't arrived yet. Asked NICU RN Eunice Blase to page lactation once she comes to hospital to check on pumping status and discuss feeding plan. Babies will be starting PO feedings today.   Katelyn White S Katelyn White 05/11/2022, 1:10 PM

## 2022-05-12 ENCOUNTER — Ambulatory Visit: Payer: Self-pay

## 2022-05-12 ENCOUNTER — Ambulatory Visit: Payer: Medicaid Other

## 2022-05-12 NOTE — Lactation Note (Addendum)
This note was copied from a baby's chart. Lactation Consultation Note  Patient Name: Katelyn White PQZRA'Q Date: 05/12/2022 Reason for consult: Follow-up assessment;Breastfeeding assistance;NICU baby;Late-preterm 34-36.6wks;Infant < 6lbs;Maternal endocrine disorder;Multiple gestation Age:23 wk.o.  Visited with family of 52 81/69 weeks old (adjusted) NICU twin female for feeding assist, Katelyn White didn't pre-pump this time because babies are already taking bottles. LC took baby to the R breast in cross cradle hold, baby was awake and alert, she latched a did a few sucks (see LATCH score). Left mom and baby doing STS and informed NICU RN Katelyn White to do a full gavage per IDF protocol. Addressed question regarding alcohol and breastfeeding that she had last night and informed family that this LC will speak with charge RN regarding this matter.   Feeding Mother's Current Feeding Choice: Breast Milk and Formula Nipple Type: Dr. Levert Feinstein Preemie  LATCH Score Latch: Repeated attempts needed to sustain latch, nipple held in mouth throughout feeding, stimulation needed to elicit sucking reflex. (with NS # 20; only a few sucks)  Audible Swallowing: None (no EBM on NS # 20; baby engaged in NNS by doing a few sucks)  Type of Nipple: Everted at rest and after stimulation (short shafted)  Comfort (Breast/Nipple): Soft / non-tender  Hold (Positioning): Assistance needed to correctly position infant at breast and maintain latch.  LATCH Score: 6  Lactation Tools Discussed/Used Tools: Pump;Flanges;Nipple Shields Nipple shield size: 20 Flange Size: 24 Breast pump type: Double-Electric Breast Pump Pump Education: Setup, frequency, and cleaning;Milk Storage Reason for Pumping: LPI twins in NICU Pumping frequency: 7 times/24 hours Pumped volume: 120 mL  Interventions Interventions: Breast feeding basics reviewed;DEBP;Education;Infant Driven Feeding Algorithm education  Plan of  care Encouraged pumping every 3 hours, ideally 8 pumping sessions/24 hours  She'll start taking babies to a full breast on feeding cues around feeding times using NS # 20 PRN   Baby's aunt present and supportive. All questions and concerns answered, family to contact [facility] services PRN.  Consult Status Consult Status: NICU follow-up Date: 05/12/22 Follow-up type: In-patient   Katelyn White Katelyn White 05/12/2022, 11:54 AM

## 2022-05-13 ENCOUNTER — Encounter (HOSPITAL_COMMUNITY): Payer: Self-pay | Admitting: Emergency Medicine

## 2022-05-13 ENCOUNTER — Inpatient Hospital Stay (HOSPITAL_COMMUNITY): Payer: Medicaid Other

## 2022-05-13 ENCOUNTER — Inpatient Hospital Stay (HOSPITAL_COMMUNITY)
Admission: EM | Admit: 2022-05-13 | Discharge: 2022-05-13 | Disposition: A | Discharge status: Home / Self Care | Payer: Medicaid Other | Attending: Obstetrics and Gynecology | Admitting: Obstetrics and Gynecology

## 2022-05-13 ENCOUNTER — Other Ambulatory Visit: Payer: Self-pay

## 2022-05-13 DIAGNOSIS — K3189 Other diseases of stomach and duodenum: Secondary | ICD-10-CM

## 2022-05-13 DIAGNOSIS — R1010 Upper abdominal pain, unspecified: Secondary | ICD-10-CM | POA: Diagnosis not present

## 2022-05-13 DIAGNOSIS — O9089 Other complications of the puerperium, not elsewhere classified: Secondary | ICD-10-CM | POA: Insufficient documentation

## 2022-05-13 LAB — URINALYSIS, ROUTINE W REFLEX MICROSCOPIC
Bilirubin Urine: NEGATIVE
Glucose, UA: NEGATIVE mg/dL
Hgb urine dipstick: NEGATIVE
Ketones, ur: NEGATIVE mg/dL
Nitrite: NEGATIVE
Protein, ur: NEGATIVE mg/dL
Specific Gravity, Urine: 1.009 (ref 1.005–1.030)
pH: 6 (ref 5.0–8.0)

## 2022-05-13 LAB — COMPREHENSIVE METABOLIC PANEL
ALT: 25 U/L (ref 0–44)
AST: 35 U/L (ref 15–41)
Albumin: 3.5 g/dL (ref 3.5–5.0)
Alkaline Phosphatase: 142 U/L — ABNORMAL HIGH (ref 38–126)
Anion gap: 12 (ref 5–15)
BUN: 9 mg/dL (ref 6–20)
CO2: 22 mmol/L (ref 22–32)
Calcium: 9.3 mg/dL (ref 8.9–10.3)
Chloride: 104 mmol/L (ref 98–111)
Creatinine, Ser: 0.68 mg/dL (ref 0.44–1.00)
GFR, Estimated: >60 mL/min (ref >60)
Glucose, Bld: 105 mg/dL — ABNORMAL HIGH (ref 70–99)
Potassium: 4.2 mmol/L (ref 3.5–5.1)
Sodium: 138 mmol/L (ref 135–145)
Total Bilirubin: 0.7 mg/dL (ref 0.3–1.2)
Total Protein: 7 g/dL (ref 6.5–8.1)

## 2022-05-13 LAB — CBC WITH DIFFERENTIAL/PLATELET
Abs Immature Granulocytes: 0.04 10*3/uL (ref 0.00–0.07)
Basophils Absolute: 0 10*3/uL (ref 0.0–0.1)
Basophils Relative: 0 %
Eosinophils Absolute: 0.1 10*3/uL (ref 0.0–0.5)
Eosinophils Relative: 1 %
HCT: 38.6 % (ref 36.0–46.0)
Hemoglobin: 11.9 g/dL — ABNORMAL LOW (ref 12.0–15.0)
Immature Granulocytes: 0 %
Lymphocytes Relative: 14 %
Lymphs Abs: 1.5 10*3/uL (ref 0.7–4.0)
MCH: 24.1 pg — ABNORMAL LOW (ref 26.0–34.0)
MCHC: 30.8 g/dL (ref 30.0–36.0)
MCV: 78.1 fL — ABNORMAL LOW (ref 80.0–100.0)
Monocytes Absolute: 0.5 10*3/uL (ref 0.1–1.0)
Monocytes Relative: 5 %
Neutro Abs: 8.5 10*3/uL — ABNORMAL HIGH (ref 1.7–7.7)
Neutrophils Relative %: 80 %
Platelets: 354 10*3/uL (ref 150–400)
RBC: 4.94 MIL/uL (ref 3.87–5.11)
RDW: 16.6 % — ABNORMAL HIGH (ref 11.5–15.5)
WBC: 10.7 10*3/uL — ABNORMAL HIGH (ref 4.0–10.5)
nRBC: 0 % (ref 0.0–0.2)

## 2022-05-13 LAB — I-STAT BETA HCG BLOOD, ED (MC, WL, AP ONLY): I-stat hCG, quantitative: 7.8 m[IU]/mL — ABNORMAL HIGH (ref <5)

## 2022-05-13 LAB — LIPASE, BLOOD: Lipase: 29 U/L (ref 11–51)

## 2022-05-13 MED ORDER — ACETAMINOPHEN 325 MG PO TABS
650.0000 mg | ORAL_TABLET | Freq: Once | ORAL | Status: AC
  Administered 2022-05-13: 650 mg via ORAL
  Filled 2022-05-13: qty 2

## 2022-05-13 MED ORDER — HYOSCYAMINE SULFATE 0.125 MG SL SUBL
0.2500 mg | SUBLINGUAL_TABLET | Freq: Once | SUBLINGUAL | Status: AC
  Administered 2022-05-13: 0.25 mg via SUBLINGUAL
  Filled 2022-05-13: qty 2

## 2022-05-13 MED ORDER — IOHEXOL 350 MG/ML SOLN
75.0000 mL | Freq: Once | INTRAVENOUS | Status: AC | PRN
  Administered 2022-05-13: 75 mL via INTRAVENOUS

## 2022-05-13 MED ORDER — HYOSCYAMINE SULFATE SL 0.125 MG SL SUBL: 0.1250 | SUBLINGUAL_TABLET | Freq: Four times a day (QID) | SUBLINGUAL | 0 refills | Status: AC | PRN

## 2022-05-13 MED ORDER — IBUPROFEN 600 MG PO TABS: 600.0000 mg | ORAL_TABLET | Freq: Four times a day (QID) | ORAL | 0 refills | Status: AC | PRN

## 2022-05-13 MED ORDER — ONDANSETRON 4 MG PO TBDP: 4.0000 mg | ORAL_TABLET | Freq: Once | ORAL | Status: DC

## 2022-05-13 NOTE — ED Triage Notes (Signed)
Pt is two weeks postpartum C section. Pt complains of abd pain above belly button. Pt had BM two days ago. Generalized Pain with palpation. No drainage from site.

## 2022-05-13 NOTE — MAU Note (Signed)
Katelyn White is a 21 y.o. here in MAU reporting: mid abdominal pain since this morning. Was given tylenol in the ED but states it does not help.  Onset of complaint: today  Pain score: 0/10 currently but 8/10 when pain happens  Vitals:   05/13/22 1403 05/13/22 1642  BP: (!) 126/90 121/79  Pulse: 98 86  Resp: 16 16  Temp: 97.8 F (36.6 C) 98.1 F (36.7 C)  SpO2: 98% 98%     FHT:NA  Lab orders placed from triage: none

## 2022-05-13 NOTE — Lactation Note (Signed)
Lactation Consultation Note  Patient Name: Katelyn White LDJTT'S Date: 05/13/2022   Age:21 y.o. P3, multiples are in NICU.  Per Birth Parent she no longer wants to BF or use DEBP, Birth Parent stopped pumping 1 day ago. Feels  BF is too  time consuming and very hard with [age] year old at home. Birth Parent breast are not engorged but fulling , LC discussed Birth Parent to using frozen ice packs and cabbage leaves that has been broken and place in freezer then apply on breast in bra. LC discussed with Birth Parent to wear supportive bra and lose fitting shirt.  Do not stimulate her breast.  She can talk with her Medical Provider about taking  Ibuprofen. Maternal Data    Feeding    LATCH Score                    Lactation Tools Discussed/Used    Interventions    Discharge    Consult Status      Katelyn White 05/13/2022, 7:14 PM

## 2022-05-13 NOTE — MAU Provider Note (Signed)
History     [id number]  Arrival date and time: 05/13/22 1345    Chief Complaint  Patient presents with   Abdominal Pain     HPI Katelyn White is a 21 y.o. at 2 weeks post partum who presents for abdominal pain. Initially presented to the ED but left from the waiting room.  Had a preterm c/section on 10/29 - twin preterm labor.  Current symptoms started this morning. Reports mid to upper abdominal pain that feels cramp like & changes location. Standing makes pain worse. Hasn't treated symptoms. Ate shrimp & rice last night & this morning. Denies n/v/d, constipation, dysuria, vaginal bleeding, or fever.    OB History     Gravida  2   Para  2   Term  1   Preterm  1   AB  0   Living  3      SAB  0   IAB  0   Ectopic  0   Multiple  1   Live Births  3           Past Medical History:  Diagnosis Date   Gestational diabetes    Headache    Infection    UTI   Preterm labor     Past Surgical History:  Procedure Laterality Date   CESAREAN SECTION MULTI-GESTATIONAL N/A 04/24/2022   Procedure: CESAREAN SECTION MULTI-GESTATIONAL;  Surgeon: Hermina StaggersErvin, Michael L, MD;  Location: [facility];  Service: Obstetrics;  Laterality: N/A;    Family History  Problem Relation Age of Onset   Diabetes Mother    Stroke Father    Hypertension Father     No Known Allergies  No current facility-administered medications on file prior to encounter.   Current Outpatient Medications on File Prior to Encounter  Medication Sig Dispense Refill   acetaminophen (TYLENOL) 500 MG tablet Take 2 tablets (1,000 mg total) by mouth every 8 (eight) hours as needed for moderate pain. 30 tablet 0   cyclobenzaprine (FLEXERIL) 10 MG tablet Take 1 tablet (10 mg total) by mouth 2 (two) times daily as needed for muscle spasms. 20 tablet 0   Glucose Blood (BLOOD GLUCOSE TEST STRIPS 333) STRP 1 strip by In Vitro route in the morning, at noon, in the evening, and at bedtime. 100 strip 6   metformin  (FORTAMET) 500 MG (OSM) 24 hr tablet Take 1 tablet (500 mg total) by mouth daily with breakfast. 30 tablet 0   Prenatal Vit-Fe Fumarate-FA (PRENATAL MULTIVITAMIN) TABS tablet Take 1 tablet by mouth daily at 12 noon.     senna-docusate (SENOKOT-S) 8.6-50 MG tablet Take 2 tablets by mouth daily. (Patient not taking: Reported on 05/05/2022) 30 tablet 0     ROS Pertinent positives and negative per HPI, all others reviewed and negative  Physical Exam   BP 105/75 (BP Location: Left Arm)   Pulse 69   Temp 98.1 F (36.7 C) (Oral)   Resp 17   SpO2 98% Comment: room air  Breastfeeding Yes   Patient Vitals for the past 24 hrs:  BP Temp Temp src Pulse Resp SpO2  05/13/22 1936 105/75 -- -- 69 17 --  05/13/22 1807 116/73 -- -- 69 -- --  05/13/22 1642 121/79 98.1 F (36.7 C) Oral 86 16 98 %  05/13/22 1403 (!) 126/90 97.8 F (36.6 C) Oral 98 16 98 %    Physical Exam Vitals and nursing note reviewed.  Constitutional:      General: She is not  in acute distress.    Appearance: She is well-developed. She is not ill-appearing.  HENT:     Head: Normocephalic and atraumatic.  Pulmonary:     Effort: Pulmonary effort is normal. No respiratory distress.  Abdominal:     General: Bowel sounds are normal.     Palpations: Abdomen is soft.     Tenderness: There is no abdominal tenderness. There is no guarding or rebound.     Hernia: No hernia is present.  Skin:    General: Skin is warm and dry.  Neurological:     Mental Status: She is alert.       Labs Results for orders placed or performed during the hospital encounter of 05/13/22 (from the past 24 hour(s))  Comprehensive metabolic panel     Status: Abnormal   Collection Time: 05/13/22  2:19 PM  Result Value Ref Range   Sodium 138 135 - 145 mmol/L   Potassium 4.2 3.5 - 5.1 mmol/L   Chloride 104 98 - 111 mmol/L   CO2 22 22 - 32 mmol/L   Glucose, Bld 105 (H) 70 - 99 mg/dL   BUN 9 6 - 20 mg/dL   Creatinine, Ser 7.42 0.44 - 1.00 mg/dL    Calcium 9.3 8.9 - 59.5 mg/dL   Total Protein 7.0 6.5 - 8.1 g/dL   Albumin 3.5 3.5 - 5.0 g/dL   AST 35 15 - 41 U/L   ALT 25 0 - 44 U/L   Alkaline Phosphatase 142 (H) 38 - 126 U/L   Total Bilirubin 0.7 0.3 - 1.2 mg/dL   GFR, Estimated >63 >87 mL/min   Anion gap 12 5 - 15  Lipase, blood     Status: None   Collection Time: 05/13/22  2:19 PM  Result Value Ref Range   Lipase 29 11 - 51 U/L  CBC with Diff     Status: Abnormal   Collection Time: 05/13/22  2:19 PM  Result Value Ref Range   WBC 10.7 (H) 4.0 - 10.5 K/uL   RBC 4.94 3.87 - 5.11 MIL/uL   Hemoglobin 11.9 (L) 12.0 - 15.0 g/dL   HCT 56.4 33.2 - 95.1 %   MCV 78.1 (L) 80.0 - 100.0 fL   MCH 24.1 (L) 26.0 - 34.0 pg   MCHC 30.8 30.0 - 36.0 g/dL   RDW 88.4 (H) 16.6 - 06.3 %   Platelets 354 150 - 400 K/uL   nRBC 0.0 0.0 - 0.2 %   Neutrophils Relative % 80 %   Neutro Abs 8.5 (H) 1.7 - 7.7 K/uL   Lymphocytes Relative 14 %   Lymphs Abs 1.5 0.7 - 4.0 K/uL   Monocytes Relative 5 %   Monocytes Absolute 0.5 0.1 - 1.0 K/uL   Eosinophils Relative 1 %   Eosinophils Absolute 0.1 0.0 - 0.5 K/uL   Basophils Relative 0 %   Basophils Absolute 0.0 0.0 - 0.1 K/uL   Immature Granulocytes 0 %   Abs Immature Granulocytes 0.04 0.00 - 0.07 K/uL  I-Stat beta hCG blood, ED     Status: Abnormal   Collection Time: 05/13/22  3:14 PM  Result Value Ref Range   I-stat hCG, quantitative 7.8 (H) <5 mIU/mL   Comment 3          Urinalysis, Routine w reflex microscopic     Status: Abnormal   Collection Time: 05/13/22  4:22 PM  Result Value Ref Range   Color, Urine YELLOW YELLOW   APPearance CLEAR CLEAR  Specific Gravity, Urine 1.009 1.005 - 1.030   pH 6.0 5.0 - 8.0   Glucose, UA NEGATIVE NEGATIVE mg/dL   Hgb urine dipstick NEGATIVE NEGATIVE   Bilirubin Urine NEGATIVE NEGATIVE   Ketones, ur NEGATIVE NEGATIVE mg/dL   Protein, ur NEGATIVE NEGATIVE mg/dL   Nitrite NEGATIVE NEGATIVE   Leukocytes,Ua MODERATE (A) NEGATIVE   RBC / HPF 0-5 0 - 5 RBC/hpf    WBC, UA 11-20 0 - 5 WBC/hpf   Bacteria, UA RARE (A) NONE SEEN   Squamous Epithelial / LPF 0-5 0 - 5    Imaging CT ABDOMEN PELVIS W CONTRAST  Result Date: 05/13/2022 CLINICAL DATA:  Pain 2 weeks postop C-section EXAM: CT ABDOMEN AND PELVIS WITH CONTRAST TECHNIQUE: Multidetector CT imaging of the abdomen and pelvis was performed using the standard protocol following bolus administration of intravenous contrast. RADIATION DOSE REDUCTION: This exam was performed according to the departmental dose-optimization program which includes automated exposure control, adjustment of the mA and/or kV according to patient size and/or use of iterative reconstruction technique. CONTRAST:  76mL OMNIPAQUE IOHEXOL 350 MG/ML SOLN COMPARISON:  None Available. FINDINGS: Lower chest: No acute abnormality. Hepatobiliary: No focal liver abnormality is seen. No gallstones, gallbladder wall thickening, or biliary dilatation. Pancreas: Unremarkable. No pancreatic ductal dilatation or surrounding inflammatory changes. Spleen: Normal in size without focal abnormality. Adrenals/Urinary Tract: Adrenal glands are unremarkable. Kidneys are normal, without renal calculi, focal lesion, or hydronephrosis. Slightly prominent mid right ureter, likely related to recent postpartum status. Bladder is unremarkable. Stomach/Bowel: Stomach is within normal limits. Appendix appears normal. No evidence of bowel wall thickening, distention, or inflammatory changes. Vascular/Lymphatic: No significant vascular findings are present. No enlarged abdominal or pelvic lymph nodes. Reproductive: Postpartum uterus. Negative for adnexal mass. Negative for pelvic hematoma. Other: Postsurgical changes of the lower anterior abdominal wall. No free air. Musculoskeletal: No acute or significant osseous findings. IMPRESSION: 1. No CT evidence for acute intra-abdominal or pelvic abnormality. 2. Postpartum uterus. Electronically Signed   By: Jasmine Pang M.D.   On:  05/13/2022 17:56    MAU Course  Procedures Lab Orders         Comprehensive metabolic panel         Lipase, blood         CBC with Diff         Urinalysis, Routine w reflex microscopic         I-Stat beta hCG blood, ED    Meds ordered this encounter  Medications   acetaminophen (TYLENOL) tablet 650 mg   ondansetron (ZOFRAN-ODT) disintegrating tablet 4 mg   iohexol (OMNIPAQUE) 350 MG/ML injection 75 mL   hyoscyamine (LEVSIN SL) SL tablet 0.25 mg   Hyoscyamine Sulfate SL (LEVSIN/SL) 0.125 MG SUBL    Sig: Place 0.125 tablets under the tongue every 6 (six) hours as needed (gastrointestinal cramping).    Dispense:  20 tablet    Refill:  0    Order Specific Question:   Supervising Provider    Answer:   CONSTANT, PEGGY [[id number]]   ibuprofen (ADVIL) 600 MG tablet    Sig: Take 1 tablet (600 mg total) by mouth every 6 (six) hours as needed for moderate pain.    Dispense:  30 tablet    Refill:  0    Order Specific Question:   Supervising Provider    Answer:   CONSTANT, PEGGY [[id number]]   Imaging Orders         CT ABDOMEN PELVIS W CONTRAST  MDM Patient started evaluation in ED but left from the lobby & came to MAU. Reviewed labs ordered from the ED - normal CT ordered from ED - normal   DBP elevated at 90 in ED triage. Repeats in MAU normal.   Based of description & location of pain - suspect pain is GI in nature. Offered levsin. Patient reports improvement in symptoms after medication. Will rx levsin & refill ibuprofen per patient request Assessment and Plan   1. Spasm of gastrointestinal tract    -Rx levsin prn -Refilled ibuprofen -Reviewed reasons to return to MAU vs ED  Judeth Horn, NP 05/13/22 9:17 PM

## 2022-05-13 NOTE — ED Provider Triage Note (Signed)
Emergency Medicine Provider Triage Evaluation Note  Katelyn White , a 21 y.o. female  was evaluated in triage.  Pt complains of upper abdominal pain this AM that is stabbing in nature. Denies any vaginal bleeding, discharge. Associated nausea, no vomiting. No fever. C-section 2 weeks ago. Uncomplicated. Well healing incision.  Review of Systems  Positive: Abdominal pain Negative: vomiting  Physical Exam  BP (!) 126/90 (BP Location: Right Arm)   Pulse 98   Temp 97.8 F (36.6 C) (Oral)   Resp 16   SpO2 98%  Gen:   Awake, no distress   Resp:  Normal effort  MSK:   Moves extremities without difficulty  Other:  +well healed C section scar, ttp of bilateral upper quadrants.   Medical Decision Making  Medically screening exam initiated at 3:32 PM.  Appropriate orders placed.  Katelyn White was informed that the remainder of the evaluation will be completed by another provider, this initial triage assessment does not replace that evaluation, and the importance of remaining in the ED until their evaluation is complete.     Pete Pelt, [place] 05/13/22 [phone]

## 2022-05-19 ENCOUNTER — Ambulatory Visit: Payer: Medicaid Other

## 2022-06-03 ENCOUNTER — Other Ambulatory Visit: Payer: Self-pay

## 2022-06-03 ENCOUNTER — Ambulatory Visit (INDEPENDENT_AMBULATORY_CARE_PROVIDER_SITE_OTHER): Payer: Medicaid Other | Admitting: Family Medicine

## 2022-06-03 VITALS — BP 118/76 | HR 98

## 2022-06-03 DIAGNOSIS — G8929 Other chronic pain: Secondary | ICD-10-CM

## 2022-06-03 DIAGNOSIS — Z4889 Encounter for other specified surgical aftercare: Secondary | ICD-10-CM

## 2022-06-03 DIAGNOSIS — Z98891 History of uterine scar from previous surgery: Secondary | ICD-10-CM

## 2022-06-03 DIAGNOSIS — M545 Low back pain, unspecified: Secondary | ICD-10-CM

## 2022-06-03 MED ORDER — CYCLOBENZAPRINE HCL 10 MG PO TABS: 10.0000 mg | ORAL_TABLET | Freq: Two times a day (BID) | ORAL | 0 refills | Status: AC | PRN

## 2022-06-03 NOTE — Progress Notes (Signed)
Katelyn White Visit Note  Katelyn White is a 21 y.o. G75P1103 female who presents for a postpartum visit. She is 6 weeks postpartum following a primary cesarean section.  I have fully reviewed the prenatal and intrapartum course. The delivery was at 33 gestational weeks.  Anesthesia: spinal. Postpartum course has been going well. Baby is doing well. Baby A is home, picking baby B up from NICU once appointment is over. Baby is feeding by bottle - Neosure . Bleeding- not currently. Pt states she stopped passing clots a week ago. Bowel function is normal. Bladder function is normal. Patient is not sexually active. Contraception method is none. Still considering options. Postpartum depression screening: negative.   The pregnancy intention screening data noted above was reviewed. Potential methods of contraception were discussed. The patient elected to proceed with No data recorded.   Edinburgh Postnatal Depression Scale - 06/03/22 1614       Edinburgh Postnatal Depression Scale:  In the Past 7 Days   I have been able to laugh and see the funny side of things. 0    I have looked forward with enjoyment to things. 0    I have blamed myself unnecessarily when things went wrong. 0    I have been anxious or worried for no good reason. 0    I have felt scared or panicky for no good reason. 0    Things have been getting on top of me. 0    I have been so unhappy that I have had difficulty sleeping. 0    I have felt sad or miserable. 0    I have been so unhappy that I have been crying. 0    The thought of harming myself has occurred to me. 0    Edinburgh Postnatal Depression Scale Total 0             Health Maintenance Due  Topic Date Due   HEMOGLOBIN A1C  Never done   FOOT EXAM  Never done   OPHTHALMOLOGY EXAM  Never done   HPV VACCINES (1 - 2-dose series) Never done   PAP-Cervical Cytology Screening  Never done   PAP SMEAR-Modifier  Never done   COVID-19 Vaccine (3 - 2023-24 season)  02/28/2022   Diabetic kidney evaluation - Urine ACR  04/09/2022       Review of Systems Pertinent items are noted in HPI.  Objective:  BP 118/76   Pulse 98   Breastfeeding No    General:  alert and no distress   Breasts:  not indicated  Lungs: Normal effort  Heart:  Regular rate noted  Abdomen: Soft, non tender    Wound well approximated incision, some evidence of superficial skin dehiscence. No surrounding erythema, pus, drainage or odor.    GU exam:  not indicated       Assessment:   1. Encounter for postoperative wound check Superficial dehiscence visualized by Dr. Currie Paris. Silver nitrate used and encouraged to keep area clean and dry. Guaze given to wick away moisture given incision is within the pannus. Return precautions given.   2. Encounter for routine postpartum follow-up   3. S/P cesarean section Twin delivery  4. Chronic bilateral low back pain without sciatica Refill flexeril PRN   Plan:   Essential components of care per ACOG recommendations:  1.  Mood and well being: Patient with negative depression screening today. Reviewed local resources for support.  - Patient tobacco use? No.   - hx of  drug use? No.    2. Infant care and feeding:  -Patient currently breastmilk feeding? No.  -Social determinants of health (SDOH) reviewed in EPIC. No concerns  3. Sexuality, contraception and birth spacing - Patient does not want a pregnancy in the next year.  Desired family size is 3 children.  - Reviewed reproductive life planning. Reviewed contraceptive methods based on pt preferences and effectiveness.  Patient desired Female Condom and Withdrawal or Other Method today.   - Discussed birth spacing of 18 months  4. Sleep and fatigue -Encouraged family/partner/community support of 4 hrs of uninterrupted sleep to help with mood and fatigue  5. Physical Recovery  - Discussed patients delivery and complications. She describes it as good. - Patient had a  C-section. Perineal healing reviewed. Patient expressed understanding - Patient has urinary incontinence? No. - Patient is safe to resume physical and sexual activity  6.  Health Maintenance - HM due items addressed No - UTD per patient - Last pap smear was done in June or July of this year in [place]. Planning to obtain records -Breast Cancer screening indicated? No.   7. Chronic Disease/Pregnancy Condition follow up:  T2DM-continue metformin and follow up with PCP  - PCP follow up  [name], DO [facility] [employer], [facility]

## 2022-06-10 IMAGING — US US OB < 14 WEEKS - US OB TV
1 series · 15 of 28 positions shown · non-contrast
Comparison: None.

CLINICAL DATA: Midline cramping.  Positive pregnancy test.

EXAM:
OBSTETRIC <14 WK US AND TRANSVAGINAL OB US
TECHNIQUE: Both transabdominal and transvaginal ultrasound examinations were
performed for complete evaluation of the gestation as well as the
maternal uterus, adnexal regions, and pelvic cul-de-sac.
Transvaginal technique was performed to assess early pregnancy.

[Series 1: us ob < 14 weeks - us ob tv · 15 of 44 slices shown]
[im 1/44]
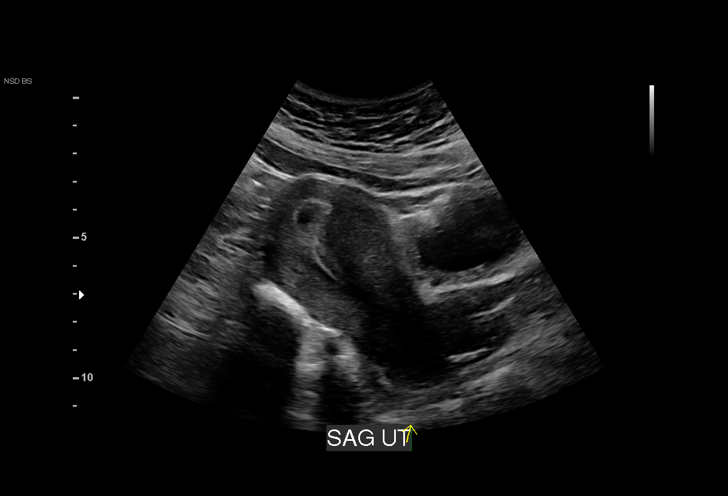
[im 4/44]
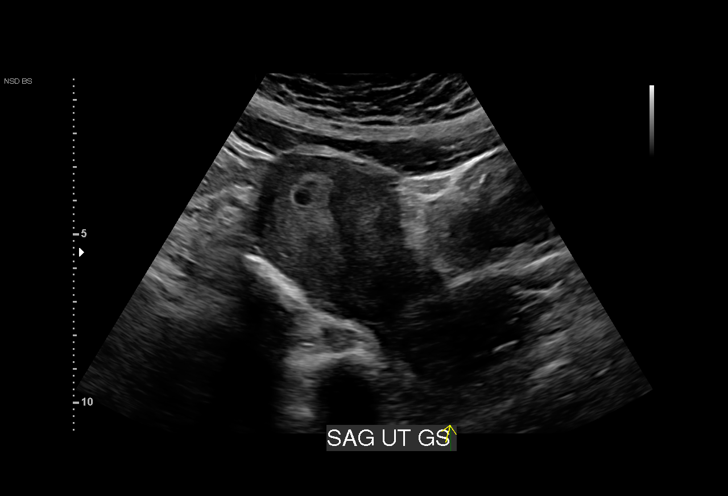
[im 7/44]
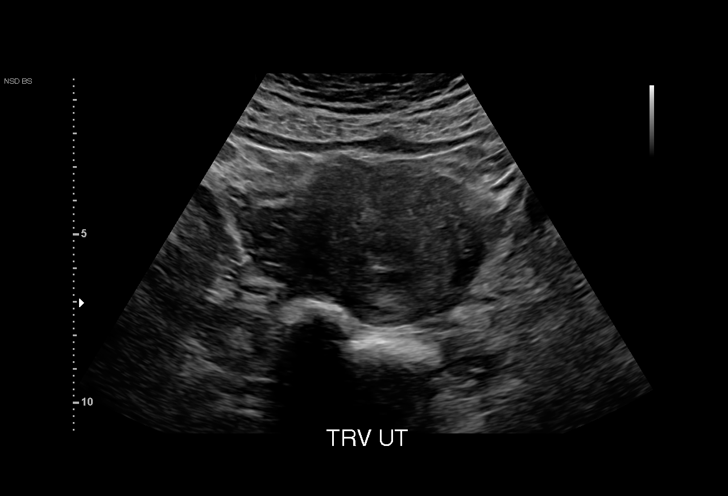
[im 10/44]
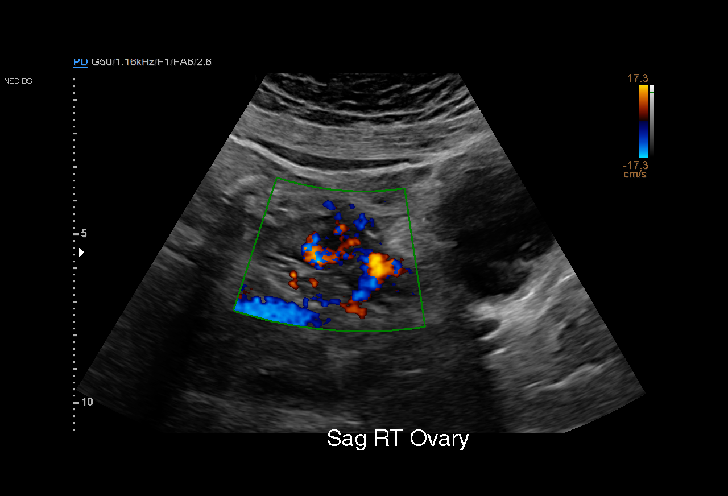
[im 13/44]
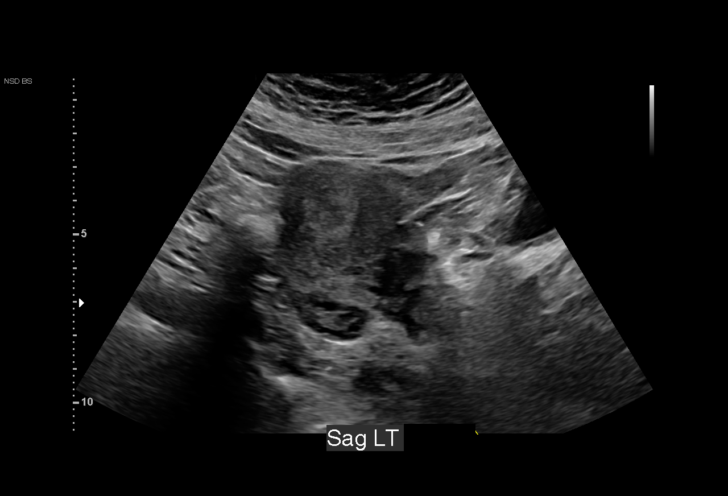
[im 16/44]
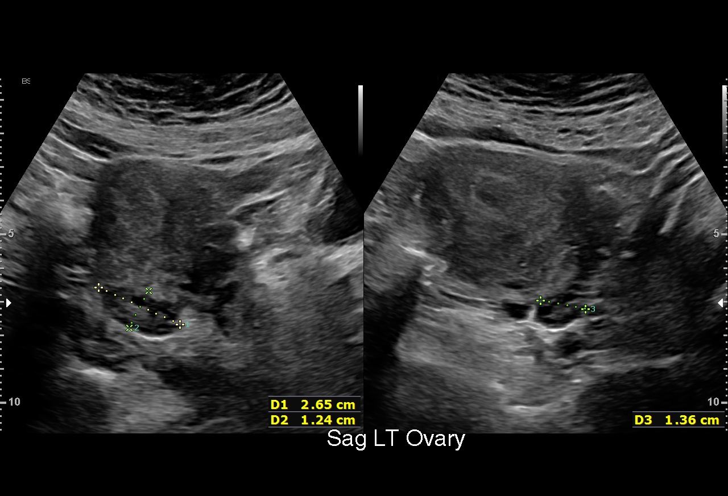
[im 20/44]
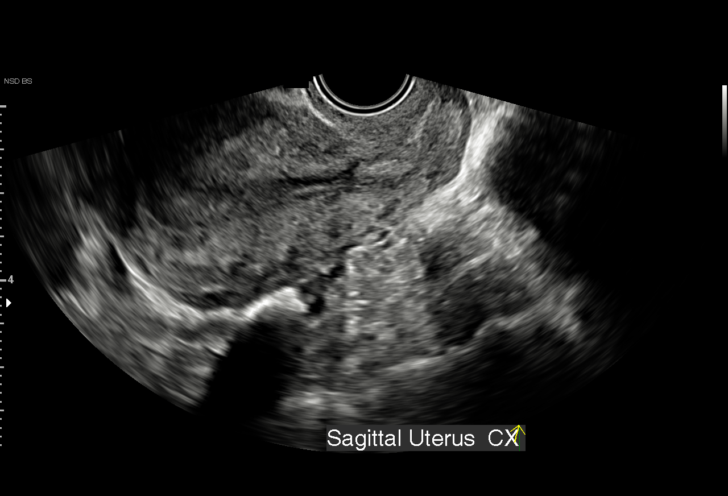
[im 23/44]
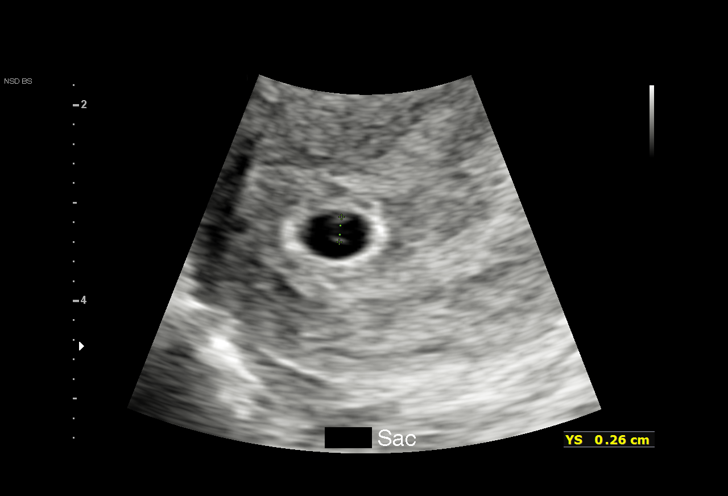
[im 24/44]
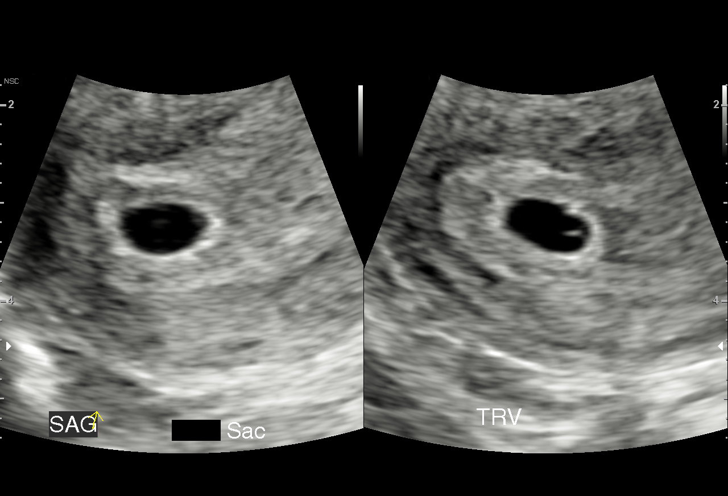
[im 28/44]
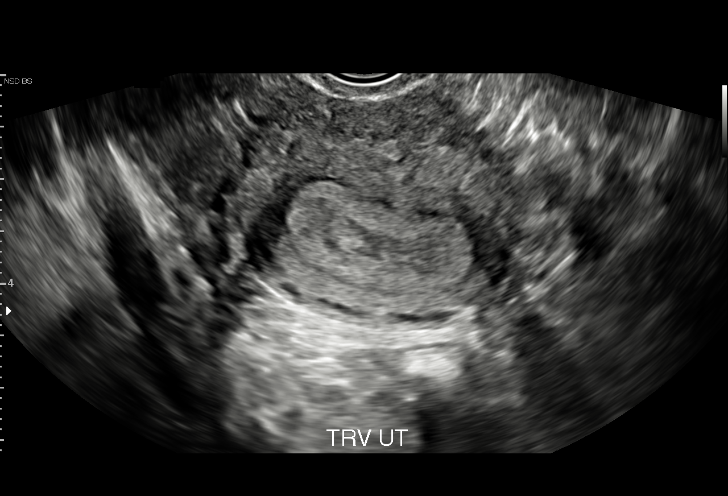
[im 31/44]
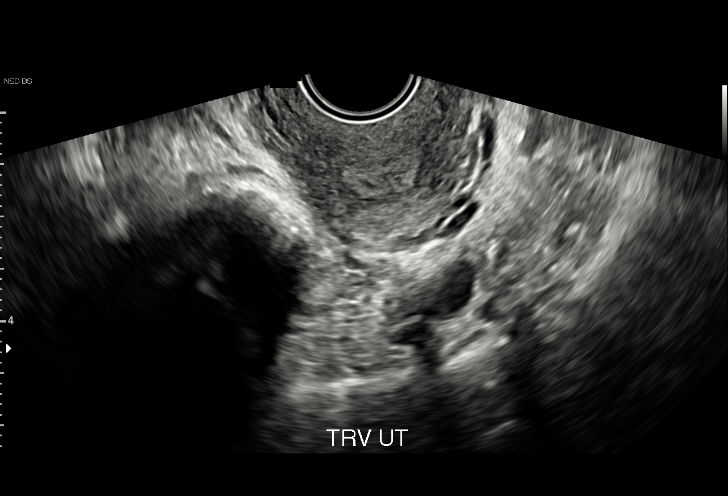
[im 34/44]
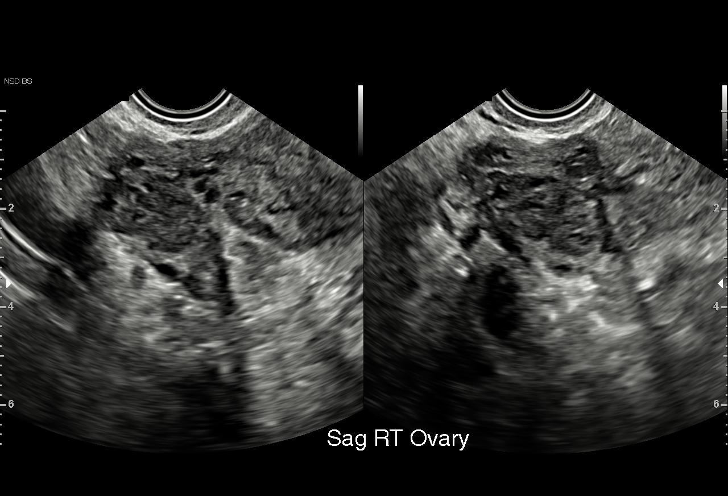
[im 37/44]
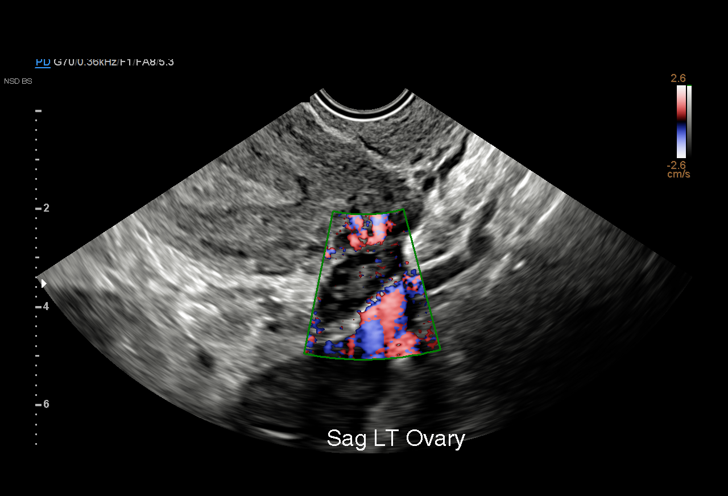
[im 40/44]
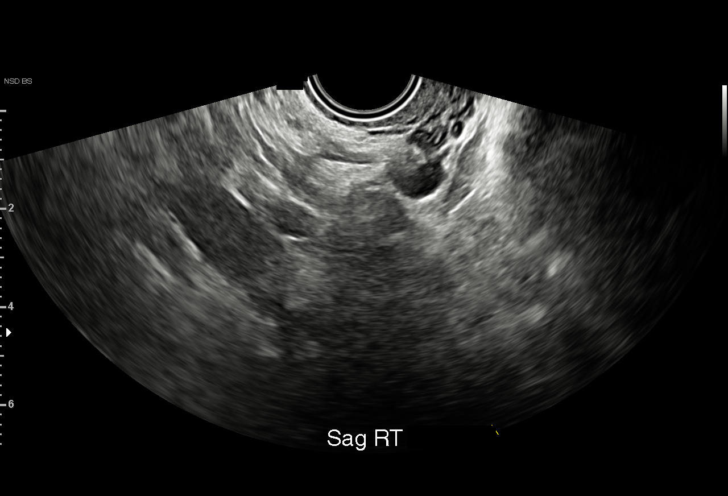
[im 44/44]
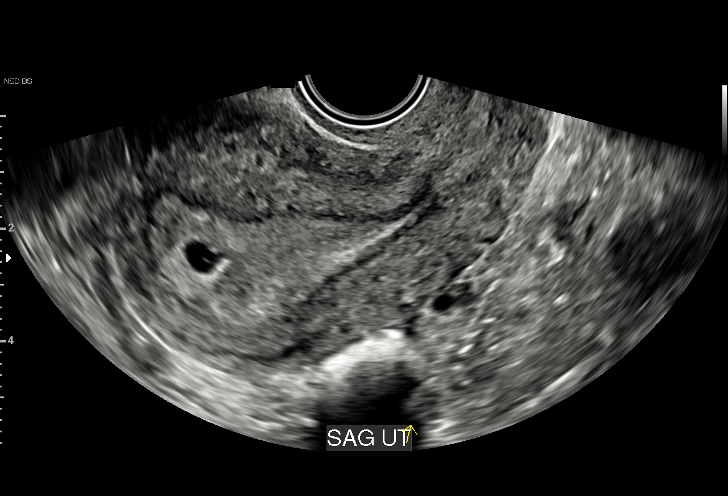

[15 of 28 positions shown; findings below may reference images not displayed]

FINDINGS: Intrauterine gestational sac: Single

Yolk sac:  Present

Embryo:  Not visualized

Cardiac Activity: Not visualized

MSD: 7.8 mm   5 w   3 d

Subchorionic hemorrhage:  None visualized.

Maternal uterus/adnexae: Within normal limits. Corpus luteal cyst
evident within the right ovary.
IMPRESSION: 1. Single intrauterine pregnancy estimated gestational age of 5
weeks and 3 days based on mean sac diameter.
2. Visualized yolk sac. Embryo and cardiac activity not visualized,
within normal limits for gestational age.

## 2022-06-28 IMAGING — US US OB TRANSVAGINAL
2 series · 15 of 28 positions shown · non-contrast
Comparison: 10/12/2021

CLINICAL DATA: Severe abdominal pain in 1st trimester pregnancy.

EXAM:
US OB TRANSVAGINAL
TECHNIQUE: Transvaginal ultrasound was performed for complete evaluation of the
gestation as well as the maternal uterus, adnexal regions, and
pelvic cul-de-sac.

[Series 2: us ob transvaginal · 14 of 44 slices shown (1 of 2)]
[im 1/44]
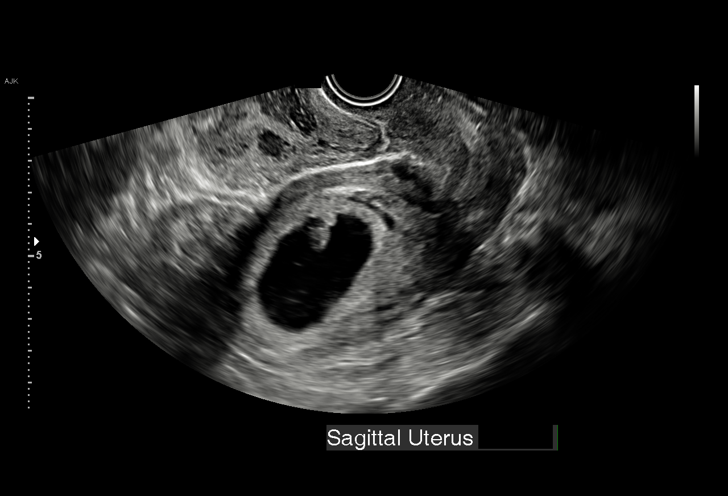
[im 4/44]
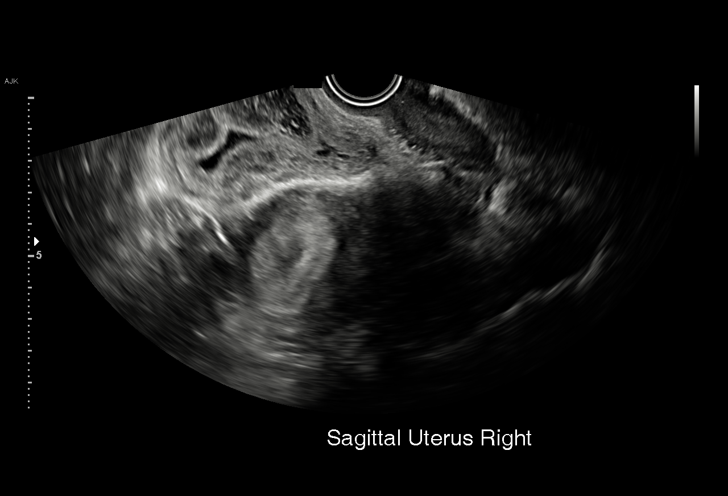
[im 7/44]
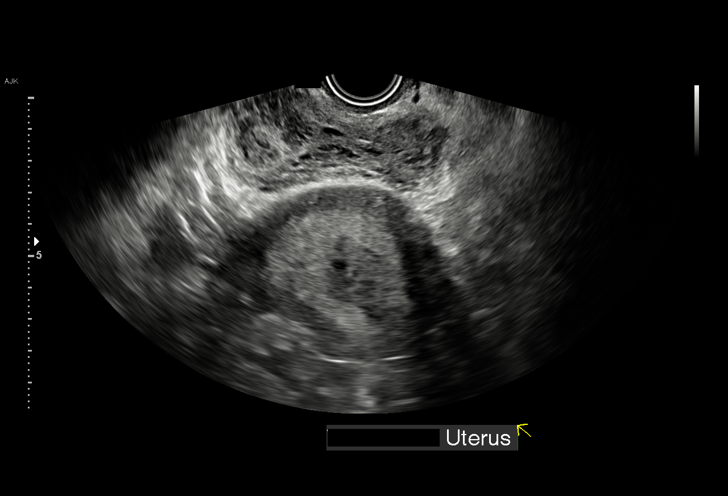
[im 11/44]
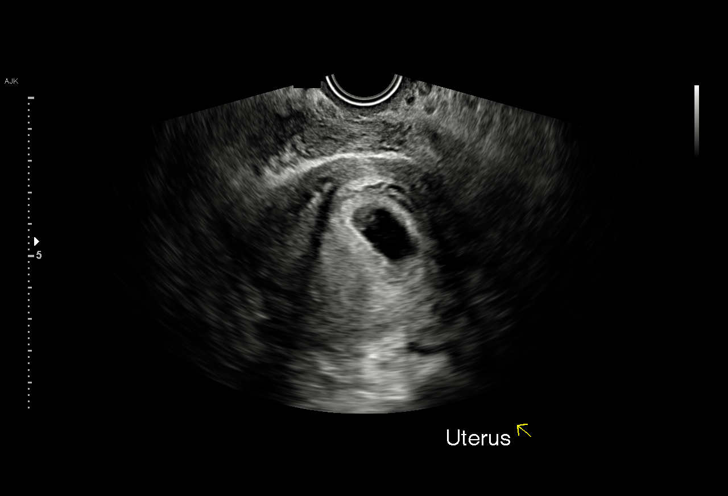
[im 14/44]
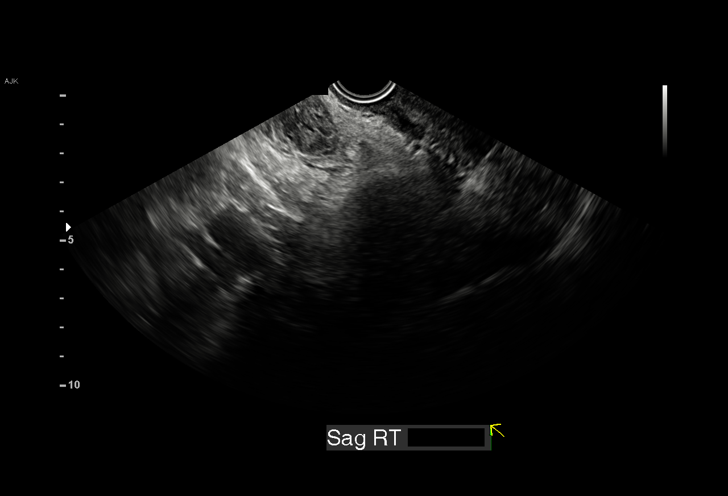
[im 18/44]
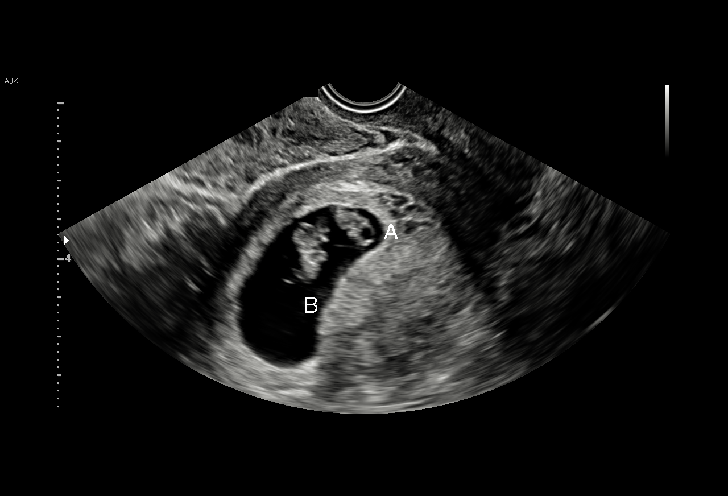
[im 21/44]
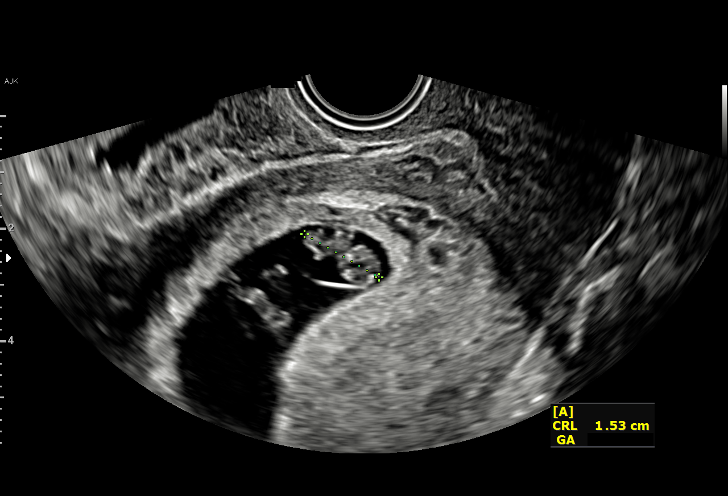
[im 25/44]
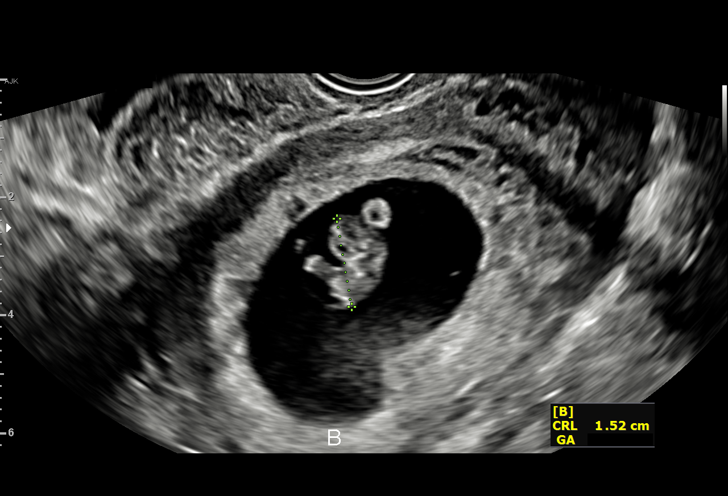
[im 26/44]
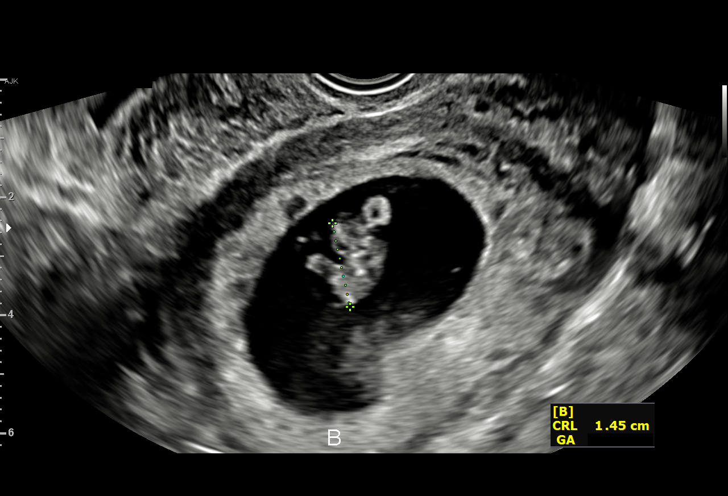
[im 30/44]
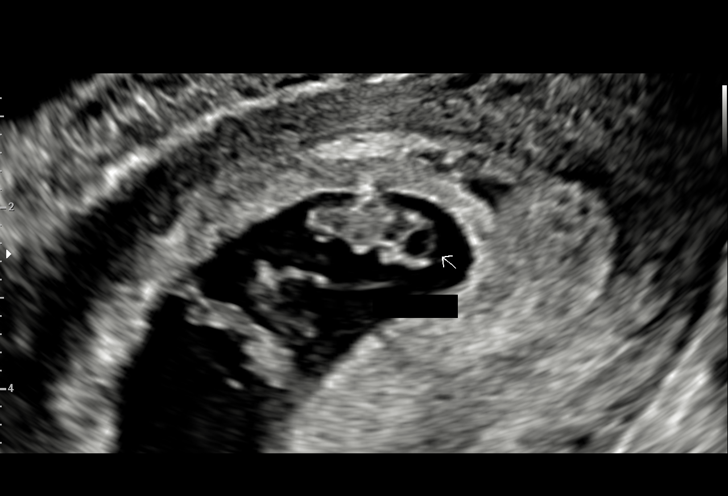
[im 33/44]
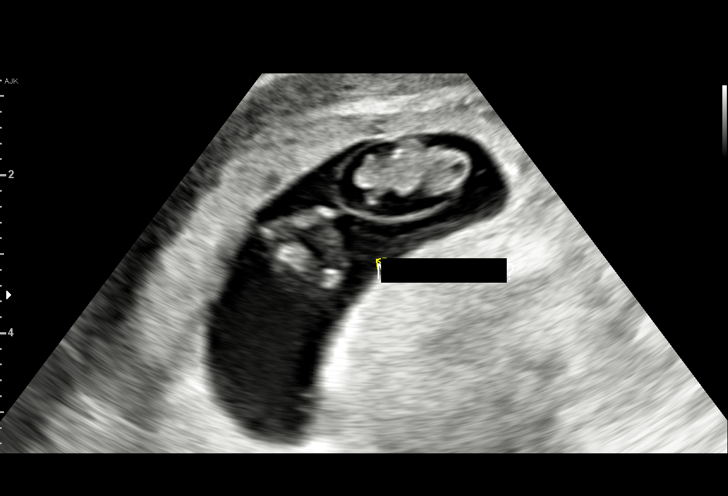
[im 37/44]
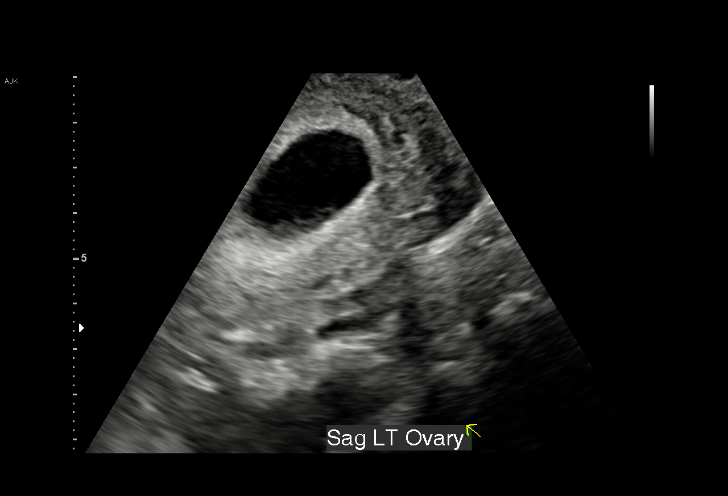
[im 40/44]
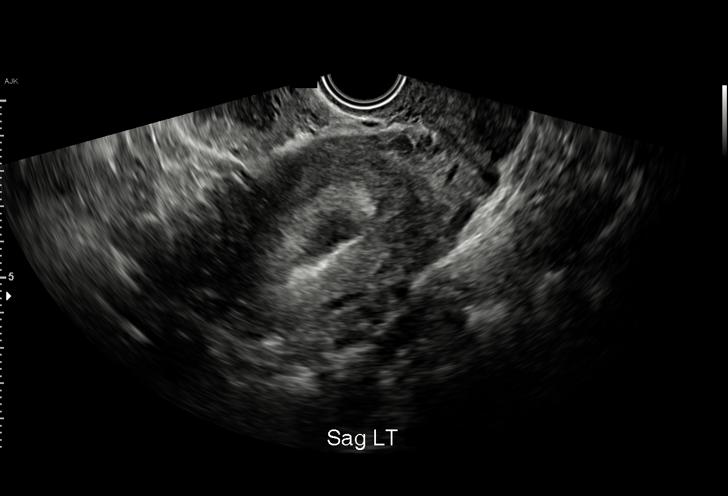
[im 44/44]
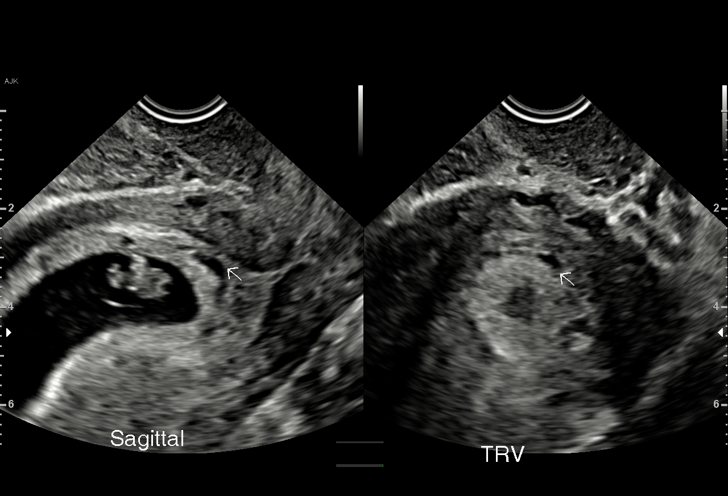

[Series 4: us ob transvaginal · 1 of 4 slices shown (2 of 2)]
[im 4/4]
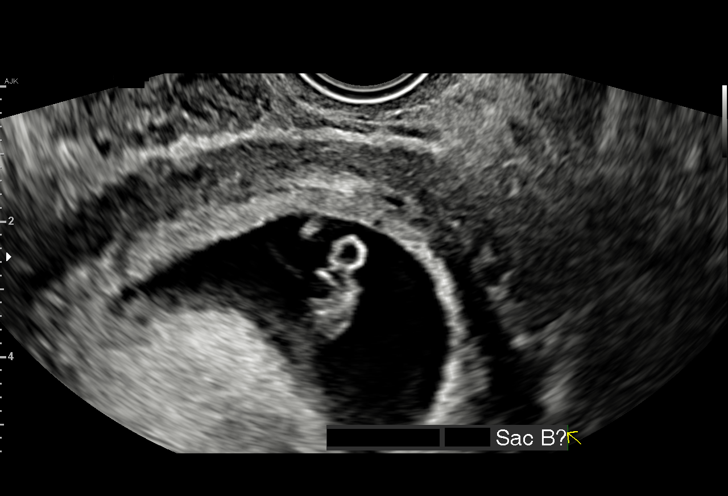

[15 of 28 positions shown; findings below may reference images not displayed]

FINDINGS: Number of IUPs:  2

Chorionicity/Amnionicity:  Monochorionic-diamniotic (thin membrane)

TWIN 1

Yolk sac:  Visualized.

Embryo:  Visualized.

Cardiac Activity: Visualized.

Heart Rate: 161 bpm

CRL:  15 mm   7 w 6 d                  US EDC: 06/12/2022

TWIN 2

Yolk sac:  Visualized.

Embryo:  Visualized.

Cardiac Activity: Visualized.

Heart Rate: 159 bpm

CRL:  15 mm   7 w 5 d                  US EDC: 06/13/2022

Subchorionic hemorrhage:  None visualized.

Maternal uterus/adnexae: Normal appearance of left ovary. Right
ovary is not directly visualized, however no mass or abnormal fluid
collection identified.
IMPRESSION: Living monochorionic, diamniotic twin IUP with estimated gestational
age of 7 weeks 6 days and US EDC of 06/12/2022.

No maternal uterine or adnexal abnormality identified.

## 2023-12-25 ENCOUNTER — Encounter (HOSPITAL_BASED_OUTPATIENT_CLINIC_OR_DEPARTMENT_OTHER): Payer: Self-pay | Admitting: Physical Therapy

## 2023-12-25 ENCOUNTER — Other Ambulatory Visit: Payer: Self-pay

## 2023-12-25 ENCOUNTER — Ambulatory Visit (HOSPITAL_BASED_OUTPATIENT_CLINIC_OR_DEPARTMENT_OTHER): Attending: Physician Assistant | Admitting: Physical Therapy

## 2023-12-25 ENCOUNTER — Encounter (HOSPITAL_BASED_OUTPATIENT_CLINIC_OR_DEPARTMENT_OTHER): Payer: Self-pay

## 2023-12-25 DIAGNOSIS — G43909 Migraine, unspecified, not intractable, without status migrainosus: Secondary | ICD-10-CM | POA: Insufficient documentation

## 2023-12-25 DIAGNOSIS — R0981 Nasal congestion: Secondary | ICD-10-CM | POA: Diagnosis not present

## 2023-12-25 DIAGNOSIS — R509 Fever, unspecified: Secondary | ICD-10-CM | POA: Diagnosis not present

## 2023-12-25 DIAGNOSIS — M6281 Muscle weakness (generalized): Secondary | ICD-10-CM | POA: Diagnosis present

## 2023-12-25 DIAGNOSIS — Z5321 Procedure and treatment not carried out due to patient leaving prior to being seen by health care provider: Secondary | ICD-10-CM | POA: Insufficient documentation

## 2023-12-25 DIAGNOSIS — R519 Headache, unspecified: Secondary | ICD-10-CM | POA: Diagnosis present

## 2023-12-25 DIAGNOSIS — M545 Low back pain, unspecified: Secondary | ICD-10-CM | POA: Insufficient documentation

## 2023-12-25 DIAGNOSIS — R059 Cough, unspecified: Secondary | ICD-10-CM | POA: Diagnosis not present

## 2023-12-25 LAB — CBC WITH DIFFERENTIAL/PLATELET
Abs Immature Granulocytes: 0.03 10*3/uL (ref 0.00–0.07)
Basophils Absolute: 0 10*3/uL (ref 0.0–0.1)
Basophils Relative: 0 %
Eosinophils Absolute: 0.1 10*3/uL (ref 0.0–0.5)
Eosinophils Relative: 1 %
HCT: 37.1 % (ref 36.0–46.0)
Hemoglobin: 11.9 g/dL — ABNORMAL LOW (ref 12.0–15.0)
Immature Granulocytes: 0 %
Lymphocytes Relative: 21 %
Lymphs Abs: 2.4 10*3/uL (ref 0.7–4.0)
MCH: 25.2 pg — ABNORMAL LOW (ref 26.0–34.0)
MCHC: 32.1 g/dL (ref 30.0–36.0)
MCV: 78.6 fL — ABNORMAL LOW (ref 80.0–100.0)
Monocytes Absolute: 0.7 10*3/uL (ref 0.1–1.0)
Monocytes Relative: 6 %
Neutro Abs: 8.1 10*3/uL — ABNORMAL HIGH (ref 1.7–7.7)
Neutrophils Relative %: 72 %
Platelets: 330 10*3/uL (ref 150–400)
RBC: 4.72 MIL/uL (ref 3.87–5.11)
RDW: 14.8 % (ref 11.5–15.5)
WBC: 11.4 10*3/uL — ABNORMAL HIGH (ref 4.0–10.5)
nRBC: 0 % (ref 0.0–0.2)

## 2023-12-25 LAB — COMPREHENSIVE METABOLIC PANEL WITH GFR
ALT: 14 U/L (ref 0–44)
AST: 17 U/L (ref 15–41)
Albumin: 4.3 g/dL (ref 3.5–5.0)
Alkaline Phosphatase: 115 U/L (ref 38–126)
Anion gap: 12 (ref 5–15)
BUN: 11 mg/dL (ref 6–20)
CO2: 22 mmol/L (ref 22–32)
Calcium: 9.7 mg/dL (ref 8.9–10.3)
Chloride: 103 mmol/L (ref 98–111)
Creatinine, Ser: 0.68 mg/dL (ref 0.44–1.00)
GFR, Estimated: >60 mL/min (ref >60)
Glucose, Bld: 102 mg/dL — ABNORMAL HIGH (ref 70–99)
Potassium: 4 mmol/L (ref 3.5–5.1)
Sodium: 138 mmol/L (ref 135–145)
Total Bilirubin: 0.4 mg/dL (ref 0.0–1.2)
Total Protein: 7.6 g/dL (ref 6.5–8.1)

## 2023-12-25 NOTE — ED Triage Notes (Addendum)
 Pt c/o really bad migraine like I've never felt before. Advises it caused dizziness/ fall, cough, fever (advised thermometer wouldn't read temp) onset yesterday. States everything started yesterday, today it's just more severe.  Hx migraines, but never like this- it's pounding really really strong. PERRLA, no neuro deficits.

## 2023-12-25 NOTE — Therapy (Signed)
 OUTPATIENT PHYSICAL THERAPY THORACOLUMBAR EVALUATION   Patient Name: Katelyn White MRN: [id number] DOB:06/01/2001, 23 y.o., female Today's Date: 12/25/2023  END OF SESSION:  PT End of Session - 12/25/23 1045     Visit Number 1    Number of Visits 18    Date for PT Re-Evaluation 03/24/24    Authorization Type [place] [facility]    PT Start Time 0850    PT Stop Time 0930    PT Time Calculation (min) 40 min    Activity Tolerance Patient tolerated treatment well;Patient limited by pain    Behavior During Therapy [facility] for tasks assessed/performed          Past Medical History:  Diagnosis Date   Gestational diabetes    Headache    Infection    UTI   Preterm labor    Past Surgical History:  Procedure Laterality Date   CESAREAN SECTION MULTI-GESTATIONAL N/A 04/24/2022   Procedure: CESAREAN SECTION MULTI-GESTATIONAL;  Surgeon: Lorence Ozell CROME, MD;  Location: [facility] LD ORS;  Service: Obstetrics;  Laterality: N/A;   Patient Active Problem List   Diagnosis Date Noted   S/P cesarean section 04/24/2022   Twin delivery by C-section 04/24/2022   Encounter for prenatal care in second trimester of first pregnancy 04/14/2022   Short cervical length during pregnancy in second trimester 03/11/2022   Maternal care for restricted fetal growth, antepartum 03/11/2022   Monochorionic diamniotic twin gestation in second trimester 03/10/2022   Pre-existing diabetes mellitus in pregnancy 02/21/2022   Monochorionic diamniotic twin gestation 02/21/2022   Supervision of other normal pregnancy, antepartum 11/01/2021    PCP: n/a  REFERRING PROVIDER:   Chinita Hoy CROME, PA-C    REFERRING DIAG:  Diagnosis  M54.50 (ICD-10-CM) - Low back pain, unspecified  G89.29 (ICD-10-CM) - Other chronic pain    Rationale for Evaluation and Treatment: Rehabilitation  THERAPY DIAG:  Pain, lumbar region  Muscle weakness (generalized)  ONSET DATE: 2022  SUBJECTIVE:                                                                                                                                                                                            SUBJECTIVE STATEMENT:  Pt reports back pain started in 2022 with first pregnancy. Vaginal birth for first, C-section for the 2nd birth- twins. Pt reports 2nd pregnancy caused back pain that went from the neck to the tailbone. The pain hurts from the low back into the mid back. Pt reports pain is constant. Pt does report moments urge incontinence. Radiating pain in to the buttocks. NT into the anterior thighs above the knee. Side sleeper and  sleeping is the most painful-sometimes needs to sleep on stomach. Pt notes NT into the feet with sitting for extended periods of time. Denies sudden onset leg weakness or legs buckling/giving way. Sitting can only be for 5 min. 20 min of walking before pain starts. Standing for about 15-20 min before pain starts. ADL hurts and occupation hurts from sitting. Stairs are the most painful when ascending. Pt does endorse hip lateral hip pain. Pt does have gym membership but has not been going. Has never been referred for pelvic health. Denies cancer red flags.   PERTINENT HISTORY:  2023 C-section; pre-diabetic   PAIN:  Are you having pain? Yes: NPRS scale: 4/10 Pain location: low back and entire back.  Pain description: sharp Aggravating factors: bending, sleeping, sitting, standing  Relieving factors: tylenol , ibuprofen   PRECAUTIONS: None  RED FLAGS: Bowel or bladder incontinence: No, Cauda equina syndrome: No, and Compression fracture: No   WEIGHT BEARING RESTRICTIONS: No  FALLS:  Has patient fallen in last 6 months? No  LIVING ENVIRONMENT: Lives with: lives with their family Lives in: House/apartment Stairs: 2 story home Has following equipment at home: None  OCCUPATION: Nail tech- sitting for work   PLOF: Independent  PATIENT GOALS: get rid of pain at baseline   OBJECTIVE:  Note: Objective measures were  completed at Evaluation unless otherwise noted.  DIAGNOSTIC FINDINGS:  N/A  PATIENT SURVEYS:  Modified Oswestry:  MODIFIED OSWESTRY DISABILITY SCALE  Date: eval Score  Pain intensity 4 =  Pain medication provides me with little relief from pain.  2. Personal care (washing, dressing, etc.) 1 =  I can take care of myself normally, but it increases my pain.  3. Lifting 3 = Pain prevents me from lifting heavy weights, but I can manage (5) I have hardly any social life because of my pain. light to medium weights if they are conveniently positioned  4. Walking 3 =  Pain prevents me from walking more than  mile.  5. Sitting 3 =  Pain prevents me from sitting more than  hour.  6. Standing 3 =  Pain prevents me from standing more than 1/2 hour.  7. Sleeping 3 =  Even when I take pain medication, I sleep less than 4 hours.  8. Social Life 2 = Pain prevents me from participating in more energetic activities (eg. sports, dancing).  9. Traveling 1 =  I can travel anywhere, but it increases my pain.  10. Employment/ Homemaking 2 = I can perform most of my homemaking/job duties, but pain prevents me from performing more physically stressful activities (eg, lifting, vacuuming).  Total 25/50   Interpretation of scores: Score Category Description  0-20% Minimal Disability The patient can cope with most living activities. Usually no treatment is indicated apart from advice on lifting, sitting and exercise  21-40% Moderate Disability The patient experiences more pain and difficulty with sitting, lifting and standing. Travel and social life are more difficult and they may be disabled from work. Personal care, sexual activity and sleeping are not grossly affected, and the patient can usually be managed by conservative means  41-60% Severe Disability Pain remains the main problem in this group, but activities of daily living are affected. These patients require a detailed investigation  61-80% Crippled Back  pain impinges on all aspects of the patient's life. Positive intervention is required  81-100% Bed-bound  These patients are either bed-bound or exaggerating their symptoms  Bluford BRAVO, Zoe DELENA Karon DELENA, et al. Surgery versus  conservative management of stable thoracolumbar fracture: the PRESTO feasibility RCT. [place]): [employer]; 2021 Nov. [facility] Technology Assessment, No. 25.62.) Appendix 3, Oswestry Disability Index category descriptors. Available from: FindJewelers.cz  Minimally Clinically Important Difference (MCID) = 12.8%  COGNITION: Overall cognitive status: Within functional limits for tasks assessed                          SENSATION: WFL   MUSCLE LENGTH: (+) supine 90/90 HS   POSTURE: significant exaggerated lumbar lordosis, posterior weight shift in standing   PALPATION:   No TTP noted in lumbar but with expected paraspinal hypertonicity; Joint extension stiffness with UPA and CPA L3-5     LUMBAR ROM:    AROM eval  Flexion 50% p!  Extension 75% stiffness  Right lateral flexion 100%  Left lateral flexion 100%  Right rotation 75%   Left rotation 75 %   (Blank rows = not tested)   LOWER EXTREMITY ROM:     Active  Right eval Left eval  Hip flexion 100 pelvic p! 100 pelvic p!  Hip extension -5 -5  Hip abduction      Hip adduction      Hip internal rotation 30 30  Hip external rotation 45 40   (Blank rows = not tested)   LOWER EXTREMITY MMT:     MMT Right eval Left eval  Hip flexion 4/5 4/5  Hip extension 4/5 4/5  Hip abduction 4/5 4/5  Hip adduction 4+/5 4+/5  Hip internal rotation      Hip external rotation      Knee flexion      Knee extension    Ankle dorsiflexion    Ankle plantarflexion      Ankle inversion      Ankle eversion       (Blank rows = not tested)     LUMBAR SPECIAL TESTS:  Straight leg raise test: negative, Single leg stance test: positive, FABER test: positive, and  Trendelenburg sign: positive   L3-5 myotomes intact   GAIT: Distance walked: 4ft Assistive device utilized: None Level of assistance: Complete Independence Comments: toe out, mild bilat Trendelenburg    TODAY'S TREATMENT:                                                                                                                              DATE:    6/27  Exercises - Supine Posterior Pelvic Tilt  - 2 x daily - 7 x weekly - 2 sets - 10 reps - 2 hold - Supine Transversus Abdominis Bracing - Hands on Stomach  - 2 x daily - 7 x weekly - 2 sets - 10 reps - 2 hold - Seated Pelvic Tilt  - 2 x daily - 7 x weekly - 2 sets - 10 reps - 2 hold      PATIENT EDUCATION:  Education details: MOI, diagnosis, prognosis, anatomy, exercise  progression, DOMS expectations, muscle firing,  envelope of function, HEP, POC   Person educated: Patient Education method: Explanation, Demonstration, Tactile cues, Verbal cues, and Handouts Education comprehension: verbalized understanding, returned demonstration, verbal cues required, tactile cues required, and needs further education   HOME EXERCISE PROGRAM:     .Access Code: [email or web address] URL: [email or web address] Date: 12/25/2023 Prepared by: Dale Call   CLINICAL IMPRESSION: Patient is a 23 y.o. female who was seen today for physical therapy evaluation and treatment for c/c of LBP. Pt's s/s appear consistent with lumbar radicular pain likely related to history of pregnancy related factors and C-section. Pt's pain is highly sensitive and irritable with movement, especially flexion based stretching or AROM. Pt's is more stiffness dominant and pain limited at this time. Plan to continue with lumbar/hip mobility, lumbopelvic strength, and pelvic rehab future sessions. Pt is more appropriate for specialized pelvic therapy as there is pelvic pain with testing and report of incontinence. Pt would benefit from continued skilled therapy in order to  reach goals and maximize functional lumbopelvic strength and ROM for return to PLOF, normalized ADL, occupation, and childcare duties.    OBJECTIVE IMPAIRMENTS: Abnormal gait, decreased activity tolerance, decreased endurance, decreased mobility, difficulty walking, decreased ROM, decreased strength, hypomobility, increased edema, impaired flexibility, and pain.    ACTIVITY LIMITATIONS: bending, squatting, stairs, transfers, bed mobility, and locomotion level   PARTICIPATION LIMITATIONS: meal prep, cleaning, laundry, driving, shopping, community activity, yard work, and hobbies   PERSONAL FACTORS: BMI, fitness, time since onset of injury are also affecting patient's functional outcome.    REHAB POTENTIAL: Good   CLINICAL DECISION MAKING: stable/uncomplicated   EVALUATION COMPLEXITY: good     GOALS:     SHORT TERM GOALS:  02/05/2024     Pt will be independent and compliant with HEP for improved pain, ROM, strength, and function.    Goal status: INITIAL     2. Pt will demonstrate at least a 12.8% improvement in Oswestry Index in order to demonstrate a clinically significant change in LBP and function.    Goal status: INITIAL       LONG TERM GOALS: 03/24/2024      Pt  will become independent with final HEP in order to demonstrate synthesis of PT education.   Goal status: INITIAL   2.  Pt will demonstrate at least a 25% improvement in Oswestry Index in order to demonstrate a clinically significant change in LBP and function.  Goal status: INITIAL     3.  Pt will be able to demonstrate/report ability to walk >30 mins on varying terrain without pain in order to demonstrate functional improvement and tolerance to exercise and community mobility.   Baseline:  Goal status: INITIAL   4.  Pt will be able to lift/squat/hold >25 lbs in order to demonstrate functional improvement in lumbopelvic strength for return to PLOF and caregiver duties.     Baseline:  Goal status:  INITIAL  5. Pt will be able to demonstrate/report ability to sit/stand/sleep for extended periods of time without pain in order to demonstrate functional improvement and tolerance to static positioning.  Goal status: INITIAL   PLAN:   PT FREQUENCY:  1-2x/week    PT DURATION: 12 weeks   PLANNED INTERVENTIONS: Therapeutic exercises, Therapeutic activity, Neuromuscular re-education, Balance training, Biofeedback, Gait training, Patient/Family education, Self Care, Joint mobilization, Stair training, DME instructions, Aquatic Therapy, Dry Needling, Electrical stimulation, Cryotherapy, Moist heat, scar mobilization, Taping, Ultrasound, Manual therapy, and Re-evaluation   PLAN FOR NEXT SESSION:  transfer to pelvic health specialists for LBP and UI  Dale Call, PT 12/25/2023, 10:45 AM   For all possible CPT codes, reference the Planned Interventions line above.     Check all conditions that are expected to impact treatment: {Conditions expected to impact treatment:Diabetes mellitus and Current pregnancy or recent postpartum   If treatment provided at initial evaluation, no treatment charged due to lack of authorization.

## 2023-12-26 ENCOUNTER — Emergency Department (HOSPITAL_BASED_OUTPATIENT_CLINIC_OR_DEPARTMENT_OTHER)
Admission: EM | Admit: 2023-12-26 | Discharge: 2023-12-26 | Discharge status: Against Medical Advice | Attending: Emergency Medicine | Admitting: Emergency Medicine

## 2023-12-26 ENCOUNTER — Encounter (HOSPITAL_COMMUNITY): Payer: Self-pay

## 2023-12-26 ENCOUNTER — Telehealth: Admitting: Nurse Practitioner

## 2023-12-26 ENCOUNTER — Emergency Department (HOSPITAL_COMMUNITY)
Admission: EM | Admit: 2023-12-26 | Discharge: 2023-12-26 | Disposition: A | Discharge status: Home / Self Care | Source: Home / Self Care | Attending: Emergency Medicine | Admitting: Emergency Medicine

## 2023-12-26 DIAGNOSIS — R519 Headache, unspecified: Secondary | ICD-10-CM | POA: Insufficient documentation

## 2023-12-26 DIAGNOSIS — R059 Cough, unspecified: Secondary | ICD-10-CM | POA: Insufficient documentation

## 2023-12-26 DIAGNOSIS — R0981 Nasal congestion: Secondary | ICD-10-CM | POA: Insufficient documentation

## 2023-12-26 LAB — RESP PANEL BY RT-PCR (RSV, FLU A&B, COVID)  RVPGX2
Influenza A by PCR: NEGATIVE
Influenza B by PCR: NEGATIVE
Resp Syncytial Virus by PCR: NEGATIVE
SARS Coronavirus 2 by RT PCR: NEGATIVE

## 2023-12-26 LAB — POC URINE PREG, ED: Preg Test, Ur: NEGATIVE

## 2023-12-26 MED ORDER — PROCHLORPERAZINE EDISYLATE 10 MG/2ML IJ SOLN
10.0000 mg | Freq: Once | INTRAMUSCULAR | Status: AC
  Administered 2023-12-26: 10 mg via INTRAVENOUS
  Filled 2023-12-26: qty 2

## 2023-12-26 MED ORDER — DEXAMETHASONE SODIUM PHOSPHATE 10 MG/ML IJ SOLN
8.0000 mg | Freq: Once | INTRAMUSCULAR | Status: AC
  Administered 2023-12-26: 8 mg via INTRAVENOUS
  Filled 2023-12-26: qty 1

## 2023-12-26 MED ORDER — DIPHENHYDRAMINE HCL 50 MG/ML IJ SOLN
12.5000 mg | Freq: Once | INTRAMUSCULAR | Status: AC
  Administered 2023-12-26: 12.5 mg via INTRAVENOUS
  Filled 2023-12-26: qty 1

## 2023-12-26 MED ORDER — ONDANSETRON HCL 4 MG/2ML IJ SOLN
4.0000 mg | Freq: Once | INTRAMUSCULAR | Status: AC
  Administered 2023-12-26: 4 mg via INTRAVENOUS
  Filled 2023-12-26: qty 2

## 2023-12-26 MED ORDER — SODIUM CHLORIDE 0.9 % IV BOLUS
500.0000 mL | Freq: Once | INTRAVENOUS | Status: AC
  Administered 2023-12-26: 500 mL via INTRAVENOUS

## 2023-12-26 MED ORDER — DIPHENHYDRAMINE HCL 50 MG/ML IJ SOLN
25.0000 mg | Freq: Once | INTRAMUSCULAR | Status: AC
  Administered 2023-12-26: 25 mg via INTRAVENOUS
  Filled 2023-12-26: qty 1

## 2023-12-26 NOTE — ED Notes (Signed)
 Writer was informed hospital wanted urine sample. Pt advised she could not provide one at this time. Cup was left in room for when she is ready

## 2023-12-26 NOTE — Discharge Instructions (Signed)
 You are seen in the emergency department today with concerns of a headache.  You are found to be negative for COVID, influenza, and RSV.  I suspect your headache is likely due to an acute headache due to a viral source.  Continue to manage your symptoms with Tylenol  ibuprofen  as needed.  Return the emergency department for any concerns of new or worsening symptoms.

## 2023-12-26 NOTE — ED Provider Notes (Signed)
 Accepted handoff at shift change from [place], PA-C. Please see prior provider note for more detail.   Briefly: Patient is 23 y.o.   DDX: concern for viral URI, migraine headache, tension headache, cluster headache  Plan: Disposition per reevaluation of patient's condition.   Physical Exam  BP 112/70   Pulse 94   Temp 98.3 F (36.8 C) (Oral)   Resp 16   Ht 5' 4 (1.626 m)   Wt 91.6 kg   SpO2 97%   BMI 34.67 kg/m   Physical Exam Vitals and nursing note reviewed.  Constitutional:      Appearance: Normal appearance.  HENT:     Head: Normocephalic and atraumatic.     Mouth/Throat:     Mouth: Mucous membranes are moist.   Eyes:     Conjunctiva/sclera: Conjunctivae normal.     Pupils: Pupils are equal, round, and reactive to light.    Cardiovascular:     Rate and Rhythm: Normal rate and regular rhythm.     Pulses: Normal pulses.     Heart sounds: Normal heart sounds.  Pulmonary:     Effort: Pulmonary effort is normal.     Breath sounds: Normal breath sounds.   Musculoskeletal:        General: Normal range of motion.     Cervical back: Normal range of motion.   Skin:    General: Skin is warm and dry.     Findings: No rash.   Neurological:     General: No focal deficit present.     Mental Status: She is alert.     Sensory: No sensory deficit.     Motor: No weakness.   Psychiatric:        Mood and Affect: Mood normal.        Behavior: Behavior normal.     Procedures  Procedures  ED Course / MDM    Medical Decision Making Risk Prescription drug management.   Patient is a handoff from [employer], PA-C.  In brief, patient here with concerns of a headache.  Patient additionally concern for possible sick contacts with multiple children at home sick with similar symptoms.  Endorsing headache is currently 10 out of 10.  On reevaluation, patient reports improvement after Decadron  and Benadryl .  Advised patient that she will require a negative pregnancy test to  ensure that she can safely receive other medications for migraine.  Patient provided urine sample.  Urine pregnancy negative.  Added on Compazine Benadryl .  On reassessment, patient reports headache is now largely resolved after Compazine.  Will discharge home with plans for outpatient follow-up.  Strict return precautions discussed.       Takina Busser A, PA-C 12/26/23 1940    Albertina Dixon, MD 12/27/23 [id number]

## 2023-12-26 NOTE — ED Triage Notes (Signed)
 Per EMS, Pt, from home, c/o headache. Congested cough, sinus pressure, and nasal congestion x2 days.  Pain score 10/10.  Pt has not taken anything for symptoms.  Sts her kids have been sick as well.    Pt was a LWBS at [employer] last night.

## 2023-12-26 NOTE — ED Notes (Signed)
 Ultrasound team contacted due to difficult IV start

## 2023-12-26 NOTE — ED Provider Notes (Signed)
 [place] EMERGENCY DEPARTMENT AT [facility] Provider Note   CSN: [id number] Arrival date & time: 12/26/23  1245     Patient presents with: Headache, URI, and Cough   Katelyn White is a 23 y.o. female with history of headaches in the past who presents the ED today for headache.  Patient reports headache for the past 2 days with congestion and cough.  Reports associated dizziness at all times, sensitivity to light, and nausea.  No head injury or trauma prior to onset of headache.  She has not been taking anything for her symptoms.  Denies any fevers, vomiting, or abdominal pain.  No changes to urinary or bowel habits.  She has been eating and drinking as normal. No additional complaints or concerns at this time.    Prior to Admission medications   Medication Sig Start Date End Date Taking? Authorizing Provider  acetaminophen  (TYLENOL ) 500 MG tablet Take 2 tablets (1,000 mg total) by mouth every 8 (eight) hours as needed for moderate pain. Patient not taking: Reported on 12/25/2023 04/27/22   Richie Harlene RODES, DO  cyclobenzaprine  (FLEXERIL ) 10 MG tablet Take 1 tablet (10 mg total) by mouth 2 (two) times daily as needed for muscle spasms. Patient not taking: Reported on 12/25/2023 06/03/22   Autry-Lott, Rojean, DO  Glucose Blood (BLOOD GLUCOSE TEST STRIPS 333) STRP 1 strip by In Vitro route in the morning, at noon, in the evening, and at bedtime. Patient not taking: Reported on 12/25/2023 03/13/22   Jayne Vonn DEL, MD  Hyoscyamine  Sulfate SL (LEVSIN AMIEL) 0.125 MG SUBL Place 0.125 tablets under the tongue every 6 (six) hours as needed (gastrointestinal cramping). Patient not taking: Reported on 12/25/2023 05/13/22   Jerilynn Longs, NP  ibuprofen  (ADVIL ) 600 MG tablet Take 1 tablet (600 mg total) by mouth every 6 (six) hours as needed for moderate pain. Patient not taking: Reported on 12/25/2023 05/13/22   Jerilynn Longs, NP  metformin  (FORTAMET ) 500 MG (OSM) 24 hr tablet Take 1  tablet (500 mg total) by mouth daily with breakfast. Patient not taking: Reported on 06/03/2022 04/27/22   Richie Harlene RODES, DO  Prenatal Vit-Fe Fumarate-FA (PRENATAL MULTIVITAMIN) TABS tablet Take 1 tablet by mouth daily at 12 noon. Patient not taking: Reported on 06/03/2022    [provider]  Semaglutide-Weight Management (WEGOVY) 0.5 MG/0.5ML SOAJ Inject 0.5 mg into the skin once a week.    [provider]  senna-docusate (SENOKOT-S) 8.6-50 MG tablet Take 2 tablets by mouth daily. Patient not taking: Reported on 05/05/2022 04/27/22   Richie Harlene RODES, DO    Allergies: Patient has no known allergies.    Review of Systems  Neurological:  Positive for headaches.  All other systems reviewed and are negative.   Updated Vital Signs BP (!) 137/92 (BP Location: Right Arm)   Pulse 98   Temp 98.1 F (36.7 C) (Oral)   Resp 16   Ht 5' 4 (1.626 m)   Wt 91.6 kg   SpO2 98%   BMI 34.67 kg/m   Physical Exam Vitals and nursing note reviewed.  Constitutional:      Appearance: Normal appearance.  HENT:     Head: Normocephalic and atraumatic.     Mouth/Throat:     Mouth: Mucous membranes are moist.   Eyes:     Conjunctiva/sclera: Conjunctivae normal.     Pupils: Pupils are equal, round, and reactive to light.    Cardiovascular:     Rate and Rhythm: Normal rate and  regular rhythm.     Pulses: Normal pulses.     Heart sounds: Normal heart sounds.  Pulmonary:     Effort: Pulmonary effort is normal.     Breath sounds: Normal breath sounds.   Musculoskeletal:        General: Normal range of motion.     Cervical back: Normal range of motion.   Skin:    General: Skin is warm and dry.     Findings: No rash.   Neurological:     General: No focal deficit present.     Mental Status: She is alert.     Sensory: No sensory deficit.     Motor: No weakness.   Psychiatric:        Mood and Affect: Mood normal.        Behavior: Behavior normal.      (all labs ordered are listed, but only abnormal results are displayed) Labs Reviewed  RESP PANEL BY RT-PCR (RSV, FLU A&B, COVID)  [id number]  POC URINE PREG, ED    EKG: None  Radiology: No results found.   Procedures   Medications Ordered in the ED  sodium chloride  0.9 % bolus 500 mL (500 mLs Intravenous New Bag/Given 12/26/23 1519)  ondansetron  (ZOFRAN ) injection 4 mg (4 mg Intravenous Given 12/26/23 1519)  diphenhydrAMINE  (BENADRYL ) injection 25 mg (25 mg Intravenous Given 12/26/23 1518)  dexamethasone  (DECADRON ) injection 8 mg (8 mg Intravenous Given 12/26/23 1519)                                    Medical Decision Making Risk Prescription drug management.   This patient presents to the ED for concern of headache, this involves an extensive number of treatment options, and is a complaint that carries with it a high risk of complications and morbidity.   Differential diagnosis includes: tension headache, cluster headache, migraine, etc. Low suspicion for ICH - no injury or trauma prior to onset of headache. No focal neurologic deficits.   Comorbidities  See HPI above   Additional History  Additional history obtained from prior records   Lab Tests  I ordered and personally interpreted labs.  The pertinent results include:   Negative respiratory panel Pregnancy test pending at shift change.   Problem List / ED Course / Critical Interventions / Medication Management  Patient reports headache since yesterday with associated nausea and dizziness.  No focal neurodeficits.  She has not taking thing for symptoms at home. I ordered medications including: Decadron , Zofran , Benadryl , normal saline for headache  I have reviewed the patients home medicines and have made adjustments as needed   Social Determinants of Health  Access to healthcare   Test / Admission - Considered  Patient care taken over by outcome provider at shift change.    Final diagnoses:   Acute nonintractable headache, unspecified headache type    ED Discharge Orders     None          Waddell Sluder, PA-C 12/26/23 1523    Lenor Hollering, MD 12/27/23 0700
